# Patient Record
Sex: Male | Born: 1980
Health system: Southern US, Community
[De-identification: ages and names within clinical notes are randomized; demographics above are authoritative.]

## PROBLEM LIST (undated history)

## (undated) ENCOUNTER — Ambulatory Visit (HOSPITAL_COMMUNITY): Discharge status: Absent without leave | Payer: Self-pay

## (undated) DIAGNOSIS — D860 Sarcoidosis of lung: Secondary | ICD-10-CM

## (undated) HISTORY — PX: LUNG BIOPSY: SHX232

---

## 2013-02-27 ENCOUNTER — Emergency Department (HOSPITAL_COMMUNITY): Payer: Self-pay

## 2013-02-27 ENCOUNTER — Encounter (HOSPITAL_COMMUNITY): Payer: Self-pay | Admitting: Family Medicine

## 2013-02-27 ENCOUNTER — Emergency Department (HOSPITAL_COMMUNITY)
Admission: EM | Admit: 2013-02-27 | Discharge: 2013-02-27 | Disposition: A | Discharge status: Home / Self Care | Payer: Self-pay | Attending: Emergency Medicine | Admitting: Emergency Medicine

## 2013-02-27 DIAGNOSIS — Y9372 Activity, wrestling: Secondary | ICD-10-CM | POA: Insufficient documentation

## 2013-02-27 DIAGNOSIS — X500XXA Overexertion from strenuous movement or load, initial encounter: Secondary | ICD-10-CM | POA: Insufficient documentation

## 2013-02-27 DIAGNOSIS — S8990XA Unspecified injury of unspecified lower leg, initial encounter: Secondary | ICD-10-CM | POA: Insufficient documentation

## 2013-02-27 DIAGNOSIS — F17200 Nicotine dependence, unspecified, uncomplicated: Secondary | ICD-10-CM | POA: Insufficient documentation

## 2013-02-27 DIAGNOSIS — Y9229 Other specified public building as the place of occurrence of the external cause: Secondary | ICD-10-CM | POA: Insufficient documentation

## 2013-02-27 DIAGNOSIS — S8991XA Unspecified injury of right lower leg, initial encounter: Secondary | ICD-10-CM

## 2013-02-27 MED ORDER — CYCLOBENZAPRINE HCL 10 MG PO TABS: 10.0000 mg | ORAL_TABLET | Freq: Two times a day (BID) | ORAL | Status: DC | PRN

## 2013-02-27 MED ORDER — HYDROCODONE-ACETAMINOPHEN 5-325 MG PO TABS: 1.0000 | ORAL_TABLET | ORAL | Status: DC | PRN

## 2013-02-27 MED ORDER — OXYCODONE-ACETAMINOPHEN 5-325 MG PO TABS: 1.0000 | ORAL_TABLET | Freq: Once | ORAL | Status: DC

## 2013-02-27 NOTE — ED Notes (Signed)
Per pt sts 2 days ago was wrestling and felt right knee pop. sts can put some weight on it. sts tried brace but not working

## 2013-02-27 NOTE — ED Provider Notes (Signed)
Medical screening examination/treatment/procedure(s) were performed by non-physician practitioner and as supervising physician I was immediately available for consultation/collaboration.   Gwyneth Sprout, MD 02/27/13 2356

## 2013-02-27 NOTE — ED Provider Notes (Signed)
History    This chart was scribed for non-physician practitioner working with Gwyneth Sprout, MD by Frederik Pear, ED Scribe. This patient was seen in room TR04C/TR04C and the patient's care was started at 2047.   CSN: [id number]  Arrival date & time 02/27/13  1756   First MD Initiated Contact with Patient 02/27/13 2047      Chief Complaint  Patient presents with  . Knee Pain    (Consider location/radiation/quality/duration/timing/severity/associated sxs/prior treatment) The history is provided by the patient. No language interpreter was used.    Michael Duarte is a 32 y.o. male who presents to the Emergency Department complaining of sudden onset, constant, non-radiating right knee pain that began 2 days ago when he was wrestling with some friends and heard a pop in his knee. He denies any pain to his ankles or hips. He denies any h/o of previous knee injuries or surgeries. He reports that the treated the pain at home with ibuprofen and applying a knee brace, which provided no relief.   History reviewed. No pertinent past medical history.  History reviewed. No pertinent past surgical history.  History reviewed. No pertinent family history.  History  Substance Use Topics  . Smoking status: Current Every Day Smoker  . Smokeless tobacco: Not on file  . Alcohol Use: Yes      Review of Systems  Constitutional: Negative for fever and chills.  Musculoskeletal: Positive for arthralgias.  All other systems reviewed and are negative.    Allergies  Review of patient's allergies indicates no known allergies.  Home Medications   Current Outpatient Rx  Name  Route  Sig  Dispense  Refill  . ibuprofen (ADVIL,MOTRIN) 200 MG tablet   Oral   Take 600 mg by mouth 2 (two) times daily as needed for pain.         . naproxen sodium (ANAPROX) 220 MG tablet   Oral   Take 660 mg by mouth 2 (two) times daily as needed (for pain).            BP 136/71  Pulse 78  Temp(Src) 98.9 F  (37.2 C) (Oral)  Resp 18  SpO2 97%  Physical Exam  Nursing note and vitals reviewed. Constitutional: He is oriented to person, place, and time. He appears well-developed and well-nourished. No distress.  HENT:  Head: Normocephalic and atraumatic.  Eyes: EOM are normal.  Neck: Neck supple. No tracheal deviation present.  Cardiovascular: Normal rate.   Pulmonary/Chest: Effort normal. No respiratory distress.  Musculoskeletal: Normal range of motion. He exhibits tenderness.  Pain over medial and lateral aspects of the right knee. Negative anterior and posterior drawer tests. Pain with varus and valgus maneuver. Negative Lachman test. Normal hip flexion and normal ankle ROM.  Neurological: He is alert and oriented to person, place, and time.  Skin: Skin is warm and dry.  Psychiatric: He has a normal mood and affect. His behavior is normal.    ED Course  Procedures (including critical care time)  DIAGNOSTIC STUDIES: Oxygen Saturation is 97% on room air, adequate by my interpretation.    COORDINATION OF CARE:  21:58- Discussed planned course of treatment with the patient, including pain medication and crutches, who is agreeable at this time.  22:00- Medication Orders- oxycodone-acetaminophen (Percocet/Roxicet) 5-325 mg per tablet 1 tablet- once.  10:09 PM Likely internal derangement of R knee.  ddx ACL, PCL, MCL tear, meniscal tear, joint effusion, bursitis.  DOubt infectious etiology.   Labs Reviewed - No data to  display Dg Knee Complete 4 Views Right  02/27/2013  *RADIOLOGY REPORT*  Clinical Data: Injury, pain.  RIGHT KNEE - COMPLETE 4+ VIEW  Comparison: None.  Findings: Imaged bones, joints and soft tissues appear normal.  IMPRESSION: Negative exam.   Original Report Authenticated By: Holley Dexter, M.D.      1. Right knee injury, initial encounter    BP 136/71  Pulse 78  Temp(Src) 98.9 F (37.2 C) (Oral)  Resp 18  SpO2 97%  I have reviewed nursing notes and vital  signs. I personally reviewed the imaging tests through PACS system  I reviewed available ER/hospitalization records thought the EMR     MDM  I personally performed the services described in this documentation, which was scribed in my presence. The recorded information has been reviewed and is accurate.         Fayrene Helper, PA-C 02/27/13 2210

## 2013-02-27 NOTE — ED Notes (Signed)
Patient said he was wrestling and injured his right knee. Patient said he injured that knee in high school after hyper-extending it playing football.

## 2013-03-18 ENCOUNTER — Emergency Department (HOSPITAL_COMMUNITY)
Admission: EM | Admit: 2013-03-18 | Discharge: 2013-03-18 | Disposition: A | Discharge status: Home / Self Care | Payer: No Typology Code available for payment source | Attending: Emergency Medicine | Admitting: Emergency Medicine

## 2013-03-18 ENCOUNTER — Encounter (HOSPITAL_COMMUNITY): Payer: Self-pay | Admitting: Adult Health

## 2013-03-18 ENCOUNTER — Emergency Department (HOSPITAL_COMMUNITY): Payer: No Typology Code available for payment source

## 2013-03-18 DIAGNOSIS — F17200 Nicotine dependence, unspecified, uncomplicated: Secondary | ICD-10-CM | POA: Insufficient documentation

## 2013-03-18 DIAGNOSIS — S8000XA Contusion of unspecified knee, initial encounter: Secondary | ICD-10-CM | POA: Insufficient documentation

## 2013-03-18 DIAGNOSIS — Y9389 Activity, other specified: Secondary | ICD-10-CM | POA: Insufficient documentation

## 2013-03-18 DIAGNOSIS — Y92410 Unspecified street and highway as the place of occurrence of the external cause: Secondary | ICD-10-CM | POA: Insufficient documentation

## 2013-03-18 DIAGNOSIS — S8001XA Contusion of right knee, initial encounter: Secondary | ICD-10-CM

## 2013-03-18 MED ORDER — HYDROCODONE-ACETAMINOPHEN 5-325 MG PO TABS: 1.0000 | ORAL_TABLET | ORAL | Status: DC | PRN
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
## 2013-03-18 NOTE — ED Notes (Signed)
Patient is alert and orientedx4.  Patient was explained discharge instructions and they understood them with no questions.  Patient is taking a cab home. 

## 2013-03-18 NOTE — ED Provider Notes (Signed)
Medical screening examination/treatment/procedure(s) were performed by non-physician practitioner and as supervising physician I was immediately available for consultation/collaboration.  Teasia Zapf K Ansh Fauble, MD 03/18/13 0804 

## 2013-08-24 ENCOUNTER — Other Ambulatory Visit (HOSPITAL_COMMUNITY)
Admission: RE | Admit: 2013-08-24 | Discharge: 2013-08-24 | Disposition: A | Discharge status: Home / Self Care | Payer: PRIVATE HEALTH INSURANCE | Source: Ambulatory Visit | Attending: Family Medicine | Admitting: Family Medicine

## 2013-08-24 ENCOUNTER — Emergency Department (INDEPENDENT_AMBULATORY_CARE_PROVIDER_SITE_OTHER)
Admission: EM | Admit: 2013-08-24 | Discharge: 2013-08-24 | Disposition: A | Discharge status: Home / Self Care | Payer: PRIVATE HEALTH INSURANCE | Source: Home / Self Care | Attending: Family Medicine | Admitting: Family Medicine

## 2013-08-24 ENCOUNTER — Encounter (HOSPITAL_COMMUNITY): Payer: Self-pay | Admitting: Emergency Medicine

## 2013-08-24 DIAGNOSIS — Z113 Encounter for screening for infections with a predominantly sexual mode of transmission: Secondary | ICD-10-CM | POA: Insufficient documentation

## 2013-08-24 DIAGNOSIS — N342 Other urethritis: Secondary | ICD-10-CM

## 2013-08-24 LAB — RPR: RPR Ser Ql: NONREACTIVE

## 2013-08-24 LAB — POCT URINALYSIS DIP (DEVICE)
Bilirubin Urine: NEGATIVE
Glucose, UA: NEGATIVE mg/dL
Leukocytes, UA: NEGATIVE
Nitrite: NEGATIVE

## 2013-08-24 MED ORDER — AZITHROMYCIN 250 MG PO TABS
ORAL_TABLET | ORAL | Status: AC
  Filled 2013-08-24: qty 4

## 2013-08-24 MED ORDER — AZITHROMYCIN 250 MG PO TABS
1000.0000 mg | ORAL_TABLET | Freq: Once | ORAL | Status: AC
  Administered 2013-08-24: 1000 mg via ORAL

## 2013-08-24 MED ORDER — LIDOCAINE HCL (PF) 1 % IJ SOLN
INTRAMUSCULAR | Status: AC
  Filled 2013-08-24: qty 5

## 2013-08-24 MED ORDER — CEFTRIAXONE SODIUM 250 MG IJ SOLR
250.0000 mg | Freq: Once | INTRAMUSCULAR | Status: AC
  Administered 2013-08-24: 250 mg via INTRAMUSCULAR

## 2013-08-24 MED ORDER — CEFTRIAXONE SODIUM 250 MG IJ SOLR
INTRAMUSCULAR | Status: AC
  Filled 2013-08-24: qty 250

## 2013-08-24 NOTE — ED Notes (Signed)
Pt up for D/C but awaiting post rocephin inj and lab draw.

## 2013-08-24 NOTE — ED Provider Notes (Signed)
CSN: [id number]     Arrival date & time 08/24/13  4098 History   First MD Initiated Contact with Patient 08/24/13 1046     Chief Complaint  Patient presents with  . SEXUALLY TRANSMITTED DISEASE   (Consider location/radiation/quality/duration/timing/severity/associated sxs/prior Treatment) HPI Comments: 32 year old smoker male with no significant past medical history here complaining of burning on urination since last night. Denies discharge from penis. He is concerned about a sexually transmitted disease has symptoms feel similar to when he had Chlamydia in the past. Denies abdominal pain, fever or chills. No scrotal, inguinal or testicular pain, swelling or redness.   History reviewed. No pertinent past medical history. History reviewed. No pertinent past surgical history. History reviewed. No pertinent family history. History  Substance Use Topics  . Smoking status: Current Every Day Smoker  . Smokeless tobacco: Not on file  . Alcohol Use: Yes    Review of Systems  Constitutional: Negative for fever and chills.  HENT: Negative for sore throat.   Gastrointestinal: Negative for abdominal pain.  Genitourinary: Positive for dysuria. Negative for hematuria, flank pain, scrotal swelling, genital sores and testicular pain.  Skin: Negative for rash.  All other systems reviewed and are negative.    Allergies  Review of patient's allergies indicates no known allergies.  Home Medications   Current Outpatient Rx  Name  Route  Sig  Dispense  Refill  . HYDROcodone-acetaminophen (NORCO/VICODIN) 5-325 MG per tablet   Oral   Take 1 tablet by mouth every 4 (four) hours as needed for pain.   12 tablet   0    There were no vitals taken for this visit. Physical Exam  Nursing note and vitals reviewed. Constitutional: He is oriented to person, place, and time. He appears well-developed and well-nourished. No distress.  HENT:  Head: Normocephalic and atraumatic.  Mouth/Throat: Oropharynx  is clear and moist. No oropharyngeal exudate.  Eyes: No scleral icterus.  Cardiovascular: Normal heart sounds.   Abdominal: Soft. There is no tenderness.  Genitourinary: Testes normal and penis normal. Cremasteric reflex is present. Circumcised. No penile erythema. No discharge found.  Lymphadenopathy:    He has no cervical adenopathy.       Right: No inguinal adenopathy present.       Left: No inguinal adenopathy present.  Neurological: He is alert and oriented to person, place, and time.  Skin: No rash noted. He is not diaphoretic.    ED Course  Procedures (including critical care time) Labs Review Labs Reviewed  POCT URINALYSIS DIP (DEVICE)  CERVICOVAGINAL ANCILLARY ONLY   Imaging Review No results found.  MDM   1. Urethritis    Treated empirically with Rocephin and 250 mg IM x1 and a azithromycin 1 g oral x1. GC Chlamydia and Trichomonas test pending at the time of discharge. Supportive care and red flags that should stricture to medical attention discussed with patient and provided in writing.  Sharin Grave, MD 08/25/13 [phone]

## 2013-08-24 NOTE — ED Notes (Signed)
C/o std.  Patient states partner says she has BV.  Patient states that when he urinates it feels funny.  Patient is unable to describe the feeling.   Denies discharge.

## 2017-06-19 ENCOUNTER — Ambulatory Visit (HOSPITAL_COMMUNITY)
Admission: EM | Admit: 2017-06-19 | Discharge: 2017-06-19 | Disposition: A | Discharge status: Home / Self Care | Payer: Medicaid Other | Attending: Internal Medicine | Admitting: Internal Medicine

## 2017-06-19 ENCOUNTER — Encounter (HOSPITAL_COMMUNITY): Payer: Self-pay | Admitting: Emergency Medicine

## 2017-06-19 DIAGNOSIS — Z202 Contact with and (suspected) exposure to infections with a predominantly sexual mode of transmission: Secondary | ICD-10-CM

## 2017-06-19 DIAGNOSIS — Z113 Encounter for screening for infections with a predominantly sexual mode of transmission: Secondary | ICD-10-CM

## 2017-06-19 HISTORY — DX: Sarcoidosis of lung: D86.0

## 2017-06-19 MED ORDER — AZITHROMYCIN 250 MG PO TABS
ORAL_TABLET | ORAL | Status: AC
  Filled 2017-06-19: qty 4

## 2017-06-19 MED ORDER — CEFTRIAXONE SODIUM 250 MG IJ SOLR
250.0000 mg | Freq: Once | INTRAMUSCULAR | Status: AC
  Administered 2017-06-19: 250 mg via INTRAMUSCULAR

## 2017-06-19 MED ORDER — AZITHROMYCIN 250 MG PO TABS
1000.0000 mg | ORAL_TABLET | Freq: Once | ORAL | Status: AC
  Administered 2017-06-19: 1000 mg via ORAL

## 2017-06-19 MED ORDER — CEFTRIAXONE SODIUM 250 MG IJ SOLR
INTRAMUSCULAR | Status: AC
  Filled 2017-06-19: qty 250

## 2017-06-19 MED ORDER — STERILE WATER FOR INJECTION IJ SOLN
INTRAMUSCULAR | Status: AC
  Filled 2017-06-19: qty 10
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
## 2017-06-19 NOTE — ED Notes (Signed)
Dirty urine collected. 

## 2017-09-01 ENCOUNTER — Emergency Department (HOSPITAL_COMMUNITY)
Admission: EM | Admit: 2017-09-01 | Discharge: 2017-09-01 | Disposition: A | Discharge status: Home / Self Care | Payer: Medicaid Other | Attending: Emergency Medicine | Admitting: Emergency Medicine

## 2017-09-01 ENCOUNTER — Encounter (HOSPITAL_COMMUNITY): Payer: Self-pay | Admitting: *Deleted

## 2017-09-01 ENCOUNTER — Emergency Department (HOSPITAL_COMMUNITY): Payer: Medicaid Other

## 2017-09-01 DIAGNOSIS — Y990 Civilian activity done for income or pay: Secondary | ICD-10-CM | POA: Insufficient documentation

## 2017-09-01 DIAGNOSIS — Y9289 Other specified places as the place of occurrence of the external cause: Secondary | ICD-10-CM | POA: Insufficient documentation

## 2017-09-01 DIAGNOSIS — W19XXXA Unspecified fall, initial encounter: Secondary | ICD-10-CM

## 2017-09-01 DIAGNOSIS — W11XXXA Fall on and from ladder, initial encounter: Secondary | ICD-10-CM | POA: Insufficient documentation

## 2017-09-01 DIAGNOSIS — D860 Sarcoidosis of lung: Secondary | ICD-10-CM | POA: Insufficient documentation

## 2017-09-01 DIAGNOSIS — S199XXA Unspecified injury of neck, initial encounter: Secondary | ICD-10-CM | POA: Insufficient documentation

## 2017-09-01 DIAGNOSIS — S0990XA Unspecified injury of head, initial encounter: Secondary | ICD-10-CM

## 2017-09-01 DIAGNOSIS — F17200 Nicotine dependence, unspecified, uncomplicated: Secondary | ICD-10-CM | POA: Diagnosis not present

## 2017-09-01 DIAGNOSIS — Y9389 Activity, other specified: Secondary | ICD-10-CM | POA: Insufficient documentation

## 2017-09-01 DIAGNOSIS — S3992XA Unspecified injury of lower back, initial encounter: Secondary | ICD-10-CM | POA: Diagnosis not present

## 2017-09-01 MED ORDER — LIDOCAINE 5 % EX PTCH: 1.0000 | MEDICATED_PATCH | CUTANEOUS | 0 refills | Status: DC

## 2017-09-01 MED ORDER — IBUPROFEN 400 MG PO TABS
600.0000 mg | ORAL_TABLET | Freq: Once | ORAL | Status: AC
  Administered 2017-09-01: 600 mg via ORAL
  Filled 2017-09-01: qty 1

## 2017-09-01 MED ORDER — METHOCARBAMOL 500 MG PO TABS: 500.0000 mg | ORAL_TABLET | Freq: Two times a day (BID) | ORAL | 0 refills | Status: DC

## 2017-09-01 MED ORDER — IBUPROFEN 600 MG PO TABS: 600.0000 mg | ORAL_TABLET | Freq: Four times a day (QID) | ORAL | 0 refills | Status: DC | PRN

## 2017-09-01 MED ORDER — HYDROCODONE-ACETAMINOPHEN 5-325 MG PO TABS: 1.0000 | ORAL_TABLET | Freq: Four times a day (QID) | ORAL | 0 refills | Status: DC | PRN

## 2017-09-01 MED ORDER — METHOCARBAMOL 500 MG PO TABS
1000.0000 mg | ORAL_TABLET | Freq: Once | ORAL | Status: AC
  Administered 2017-09-01: 1000 mg via ORAL
  Filled 2017-09-01: qty 2

## 2017-09-01 MED ORDER — OXYCODONE-ACETAMINOPHEN 5-325 MG PO TABS
2.0000 | ORAL_TABLET | Freq: Once | ORAL | Status: AC
  Administered 2017-09-01: 2 via ORAL
  Filled 2017-09-01: qty 2

## 2017-09-01 NOTE — ED Provider Notes (Signed)
[facility]-EMERGENCY DEPT Provider Note   CSN: [id number] Arrival date & time: 09/01/17  1011     History   Chief Complaint Chief Complaint  Patient presents with  . Fall    HPI Michael Duarte is a 36 y.o. male.  HPI  Michael Duarte is a 36 y.o. male, presenting to the ED For evaluation following a fall that occurred yesterday around noon. 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 He landed flat on his back and endorses hitting the back of his head. Patient currently complains of right occipital head pain, described as a soreness, 7/10, nonradiating. Midline posterior neck pain is, 7/10, described as a tightness, nonradiating. Midline lumbar back pain, 10/10, nonradiating.   Denies LOC, nausea/vomiting, changes in bowel or bladder function, hematuria, neuro deficits, vision changes, chest pain, shortness of breath, abdominal pain, or any other complaints.   Past Medical History:  Diagnosis Date  . Sarcoidosis of lung (HCC)     There are no active problems to display for this patient.   History reviewed. No pertinent surgical history.     Home Medications    Prior to Admission medications   Medication Sig Start Date End Date Taking? Authorizing Provider  HYDROcodone-acetaminophen (NORCO/VICODIN) 5-325 MG tablet Take 1-2 tablets by mouth every 6 (six) hours as needed for severe pain. 09/01/17   Kristie Bracewell C, PA-C  ibuprofen (ADVIL,MOTRIN) 600 MG tablet Take 1 tablet (600 mg total) by mouth every 6 (six) hours as needed. 09/01/17   Spiro Ausborn C, PA-C  lidocaine (LIDODERM) 5 % Place 1 patch onto the skin daily. Remove & Discard patch within 12 hours or as directed by MD 09/01/17   Laurynn Mccorvey C, PA-C  methocarbamol (ROBAXIN) 500 MG tablet Take 1 tablet (500 mg total) by mouth 2 (two) times daily. 09/01/17   Kallin Henk, Hillard DankerShawn C, PA-C    Family History No family history on file.  Social History Social History  Substance Use Topics  . Smoking status: Current  Every Day Smoker  . Smokeless tobacco: Never Used  . Alcohol use Yes     Allergies   Patient has no known allergies.   Review of Systems Review of Systems  Respiratory: Negative for shortness of breath.   Cardiovascular: Negative for chest pain.  Gastrointestinal: Negative for abdominal pain, nausea and vomiting.  Genitourinary: Negative for difficulty urinating and hematuria.  Musculoskeletal: Positive for back pain and neck pain.  Neurological: Positive for headaches. Negative for dizziness, syncope, weakness, light-headedness and numbness.  All other systems reviewed and are negative.    Physical Exam Updated Vital Signs BP (!) 145/98 (BP Location: Right Arm)   Pulse 73   Temp 98.3 F (36.8 C) (Oral)   Resp 20   SpO2 98%   Physical Exam  Constitutional: He appears well-developed and well-nourished. No distress.  HENT:  Head: Normocephalic and atraumatic. Head is without raccoon's eyes and without Battle's sign.  Right Ear: Tympanic membrane, external ear and ear canal normal. No hemotympanum.  Left Ear: Tympanic membrane, external ear and ear canal normal. No hemotympanum.  Mouth/Throat: Oropharynx is clear and moist.  Eyes: Pupils are equal, round, and reactive to light. Conjunctivae and EOM are normal.  Neck: Neck supple.  Cardiovascular: Normal rate, regular rhythm, normal heart sounds and intact distal pulses.   Pulmonary/Chest: Effort normal and breath sounds normal. No respiratory distress.  Abdominal: Soft. There is no tenderness. There is no guarding.  Musculoskeletal: He  exhibits tenderness. He exhibits no edema.  Tenderness to the midline cervical spine and flanking musculature. Tenderness to the midline lumbar musculature. No noted step-off, deformity, crepitus, or swelling.  Full range of motion in each of the cardinal directions of the joints of the upper extremities. Full flexion and extension intact in the hips, knees, and ankles bilaterally.  No  CVA tenderness or ecchymosis. Overall trauma exam performed with no abnormalities noted other than those indicated.  Neurological: He is alert.  No sensory deficits.  No noted speech deficits. No aphasia. Patient handles oral secretions without difficulty. No noted swallowing defects.  Equal grip strength bilaterally. Strength 5/5 in the upper extremities. Strength 5/5 with flexion and extension of the hips, knees, and ankles bilaterally.  Patellar DTRs 2+ bilaterally. Negative Romberg. No gait disturbance.  Coordination intact including heel to shin and finger to nose.  Cranial nerves III-XII grossly intact.  No facial droop.   Skin: Skin is warm and dry. Capillary refill takes less than 2 seconds. He is not diaphoretic.  Psychiatric: He has a normal mood and affect. His behavior is normal.  Nursing note and vitals reviewed.    ED Treatments / Results  Labs (all labs ordered are listed, but only abnormal results are displayed) Labs Reviewed - No data to display  EKG  EKG Interpretation None       Radiology Dg Thoracic Spine 2 View  Result Date: 09/01/2017 CLINICAL DATA:  Larey Seat off of a ladder yesterday and injured back. EXAM: THORACIC SPINE 2 VIEWS COMPARISON:  None. FINDINGS: Normal alignment of the thoracic vertebral bodies. No acute fracture is identified. No abnormal paraspinal soft tissue swelling. The visualized posterior ribs appear intact. The lungs are grossly clear. IMPRESSION: Normal alignment and no acute bony findings. Electronically Signed   By: Rudie Meyer M.D.   On: 09/01/2017 13:05   Dg Lumbar Spine Complete  Result Date: 09/01/2017 CLINICAL DATA:  Midline back tenderness after fall. EXAM: LUMBAR SPINE - COMPLETE 4+ VIEW COMPARISON:  None. FINDINGS: There are 5 lumbar type vertebral bodies. There is no evidence of lumbar spine fracture. Vertebral body heights are preserved. Straightening of the normal lumbar lordosis. Alignment is otherwise normal.  Intervertebral disc spaces are maintained. No pars defects. Bone mineralization is normal. IMPRESSION: Negative. Electronically Signed   By: Obie Dredge M.D.   On: 09/01/2017 13:01   Ct Head Wo Contrast  Result Date: 09/01/2017 CLINICAL DATA:  Pt fell off a ladder yesterday and fell back onto his back flat on a wooden porch. Pt states he was up 6-8 feet. Pt denies LOC, but reports hit head. Complaining of lower back pain, neck pain, and headache. EXAM: CT HEAD WITHOUT CONTRAST CT CERVICAL SPINE WITHOUT CONTRAST TECHNIQUE: Multidetector CT imaging of the head and cervical spine was performed following the standard protocol without intravenous contrast. Multiplanar CT image reconstructions of the cervical spine were also generated. COMPARISON:  None. FINDINGS: CT HEAD FINDINGS Brain: No evidence of acute infarction, hemorrhage, hydrocephalus, extra-axial collection or mass lesion/mass effect. Vascular: No hyperdense vessel or unexpected calcification. Skull: No osseous abnormality. Sinuses/Orbits: Visualized paranasal sinuses are clear. Visualized mastoid sinuses are clear. Visualized orbits demonstrate no focal abnormality. Other: None CT CERVICAL SPINE FINDINGS Alignment: Normal. Skull base and vertebrae: No acute fracture. No primary bone lesion or focal pathologic process. Soft tissues and spinal canal: No prevertebral fluid or swelling. No visible canal hematoma. Disc levels: Disc spaces are maintained. Left paracentral disc protrusion at C4-5. Bilateral neural foramina  are patent. Upper chest: Mild biapical scarring Other: No fluid collection or hematoma. IMPRESSION: 1. No acute intracranial pathology. 2. No acute osseous injury the cervical spine. Electronically Signed   By: Elige Ko   On: 09/01/2017 12:49   Ct Cervical Spine Wo Contrast  Result Date: 09/01/2017 CLINICAL DATA:  Pt fell off a ladder yesterday and fell back onto his back flat on a wooden porch. Pt states he was up 6-8 feet. Pt  denies LOC, but reports hit head. Complaining of lower back pain, neck pain, and headache. EXAM: CT HEAD WITHOUT CONTRAST CT CERVICAL SPINE WITHOUT CONTRAST TECHNIQUE: Multidetector CT imaging of the head and cervical spine was performed following the standard protocol without intravenous contrast. Multiplanar CT image reconstructions of the cervical spine were also generated. COMPARISON:  None. FINDINGS: CT HEAD FINDINGS Brain: No evidence of acute infarction, hemorrhage, hydrocephalus, extra-axial collection or mass lesion/mass effect. Vascular: No hyperdense vessel or unexpected calcification. Skull: No osseous abnormality. Sinuses/Orbits: Visualized paranasal sinuses are clear. Visualized mastoid sinuses are clear. Visualized orbits demonstrate no focal abnormality. Other: None CT CERVICAL SPINE FINDINGS Alignment: Normal. Skull base and vertebrae: No acute fracture. No primary bone lesion or focal pathologic process. Soft tissues and spinal canal: No prevertebral fluid or swelling. No visible canal hematoma. Disc levels: Disc spaces are maintained. Left paracentral disc protrusion at C4-5. Bilateral neural foramina are patent. Upper chest: Mild biapical scarring Other: No fluid collection or hematoma. IMPRESSION: 1. No acute intracranial pathology. 2. No acute osseous injury the cervical spine. Electronically Signed   By: Elige Ko   On: 09/01/2017 12:49    Procedures Procedures (including critical care time)  Medications Ordered in ED Medications  oxyCODONE-acetaminophen (PERCOCET/ROXICET) 5-325 MG per tablet 2 tablet (2 tablets Oral Given 09/01/17 1153)  ibuprofen (ADVIL,MOTRIN) tablet 600 mg (600 mg Oral Given 09/01/17 1153)  methocarbamol (ROBAXIN) tablet 1,000 mg (1,000 mg Oral Given 09/01/17 1152)     Initial Impression / Assessment and Plan / ED Course  I have reviewed the triage vital signs and the nursing notes.  Pertinent labs & imaging results that were available during my care of  the patient were reviewed by me and considered in my medical decision making (see chart for details).  Clinical Course as of Sep 02 1455  Tue Sep 01, 2017  1300 Patient states his pain is improved to 4/10 all around. Discussed negative head and cervical spine CTs. Removed patient's cervical collar.  [SJ]    Clinical Course User Index [SJ] Demaryius Imran C, PA-C    Patient presents for evaluation following a fall yesterday. Distance of the fall was significant enough to warrant CT of the head and cervical spine. No acute abnormalities on imaging. No neuro or functional deficits. No signs of serious head injury. Patient to follow-up with PCP, orthopedics, and/or concussion clinic, if needed. The patient was given instructions for home care as well as return precautions. Patient voices understanding of these instructions, accepts the plan, and is comfortable with discharge.    Vitals:   09/01/17 1028 09/01/17 1138 09/01/17 1145 09/01/17 1447  BP: (!) 145/98 131/75 128/80 140/86  Pulse: 73 69 64 61  Resp: Temp: 98.3 F (36.8 C)     TempSrc: Oral     SpO2: 98% 99% 98% 100%     Final Clinical Impressions(s) / ED Diagnoses   Final diagnoses:  Fall, initial encounter  Injury of head, initial encounter  Injury of neck, initial  encounter  Injury of back, initial encounter    New Prescriptions Discharge Medication List as of 09/01/2017  2:21 PM    START taking these medications   Details  ibuprofen (ADVIL,MOTRIN) 600 MG tablet Take 1 tablet (600 mg total) by mouth every 6 (six) hours as needed., Starting Tue 09/01/2017, Print    lidocaine (LIDODERM) 5 % Place 1 patch onto the skin daily. Remove & Discard patch within 12 hours or as directed by MD, Starting Tue 09/01/2017, Print    methocarbamol (ROBAXIN) 500 MG tablet Take 1 tablet (500 mg total) by mouth 2 (two) times daily., Starting Tue 09/01/2017, Print         Izayah Miner C, PA-C 09/01/17 1458    Pricilla Loveless,  MD 09/04/17 [phone]

## 2017-09-01 NOTE — Discharge Instructions (Signed)
Expect your soreness to increase over the next 2-3 days. Take it easy, but do not lay around too much as this may make any stiffness worse.  Antiinflammatory medications: Take 600 mg of ibuprofen every 6 hours or 440 mg (over the counter dose) to 500 mg (prescription dose) of naproxen every 12 hours for the next 3 days. After this time, these medications may be used as needed for pain. Take these medications with food to avoid upset stomach. Choos dditional pain while taking the ibuprofen or naproxen, you may add in tylenol as needed. Your daily total maximum amount of tylenol from all sources should be limited to 4000mg /day for persons without liver problems, or /day for those with liver problems. Muscle relaxer: Robaxin is a muscle relaxer and may help loosen stiff muscles. Do not take the Robaxin while driving or performing other dangerous activities.  Lidocaine patches: These are available via either prescription or over-the-counter. The over-the-counter option may be more economical one and are likely just as effective. There are multiple over-the-counter brands, such as Salonpas. Exercises: Be sure to perform the attached exercises starting with three times a week and working up to performing them daily. This is an essential part of preventing long term problems.   Follow up with a primary care provider for any future management of these complaints. May also follow-up with the orthopedic specialist on this matter.   Head Injury You have been seen today for a head injury. It does not appear to be serious at this time.  Close observation: The close observation period is usually 6 hours from the injury. This includes staying awake and having a trustworthy adult monitor you to assure your condition does not worsen. You should be in regular contact with this person and ideally, they should be able to  monitor you in person.  Secondary observation: The secondary observation period is usually 24 hours from the injury. You are allowed to sleep during this time. A trustworthy adult should intermittently monitor you to assure your condition does not worsen.   Overall head injury/concussion care: Rest: Be sure to get plenty of rest. You will need more rest and sleep while you recover. Hydration: Be sure to stay well hydrated by having a goal of drinking about 0.5 liters of water an hour. Pain:  Antiinflammatory medications: Take 600 mg of ibuprofen every 6 hours or 440 mg (over the counter dose) to 500 mg (prescription dose) of naproxen every 12 hours or for the next 3 days. After this time, these medications may be used as needed for pain. Take these medications with food to avoid upset stomach. Choose only one of these medications, do not take them together. Tylenol: Should you continue to have additional pain while taking the ibuprofen or naproxen, you may add in tylenol as needed. Your daily total maximum amount of tylenol from all sources should be limited to /day for persons without liver problems, or /day for those with liver problems. Return to sports and activities: In general, you may return to normal activities once symptoms have subsided, however, you would ideally be cleared by a primary care provider or other qualified medical professional prior to return to these activities.  Follow up: Follow up with the concussion clinic or your primary care provider for further management of this issue. Return: Return to the ED should any symptoms worsen.

## 2017-09-01 NOTE — ED Triage Notes (Signed)
Pt fell off a ladder yesterday and fell back onto his back flat on a wooden porch.  Pt states he was up 6-8 feet.  Pt denies LOC, but reports hit head.  Complaining of lower back pain, neck pain, and headache.  Ambulatory.  Collared at triage

## 2017-12-23 ENCOUNTER — Emergency Department (HOSPITAL_COMMUNITY): Payer: Medicaid Other

## 2017-12-23 ENCOUNTER — Encounter (HOSPITAL_COMMUNITY): Payer: Self-pay | Admitting: Emergency Medicine

## 2017-12-23 ENCOUNTER — Emergency Department (HOSPITAL_COMMUNITY)
Admission: EM | Admit: 2017-12-23 | Discharge: 2017-12-23 | Disposition: A | Discharge status: Home / Self Care | Payer: Medicaid Other | Attending: Emergency Medicine | Admitting: Emergency Medicine

## 2017-12-23 DIAGNOSIS — D8689 Sarcoidosis of other sites: Secondary | ICD-10-CM | POA: Insufficient documentation

## 2017-12-23 DIAGNOSIS — Z87891 Personal history of nicotine dependence: Secondary | ICD-10-CM | POA: Insufficient documentation

## 2017-12-23 DIAGNOSIS — J029 Acute pharyngitis, unspecified: Secondary | ICD-10-CM | POA: Insufficient documentation

## 2017-12-23 DIAGNOSIS — R05 Cough: Secondary | ICD-10-CM | POA: Insufficient documentation

## 2017-12-23 DIAGNOSIS — Z79899 Other long term (current) drug therapy: Secondary | ICD-10-CM | POA: Insufficient documentation

## 2017-12-23 DIAGNOSIS — R509 Fever, unspecified: Secondary | ICD-10-CM | POA: Insufficient documentation

## 2017-12-23 DIAGNOSIS — R69 Illness, unspecified: Secondary | ICD-10-CM

## 2017-12-23 DIAGNOSIS — J111 Influenza due to unidentified influenza virus with other respiratory manifestations: Secondary | ICD-10-CM

## 2017-12-23 DIAGNOSIS — R6889 Other general symptoms and signs: Secondary | ICD-10-CM | POA: Insufficient documentation

## 2017-12-23 DIAGNOSIS — D869 Sarcoidosis, unspecified: Secondary | ICD-10-CM

## 2017-12-23 DIAGNOSIS — R0981 Nasal congestion: Secondary | ICD-10-CM | POA: Diagnosis present

## 2017-12-23 LAB — CBC WITH DIFFERENTIAL/PLATELET
BASOS ABS: 0 10*3/uL (ref 0.0–0.1)
Basophils Relative: 0 %
EOS PCT: 2 %
Eosinophils Absolute: 0.1 10*3/uL (ref 0.0–0.7)
HCT: 42.2 % (ref 39.0–52.0)
Hemoglobin: 14.4 g/dL (ref 13.0–17.0)
Lymphocytes Relative: 26 %
Lymphs Abs: 1.5 10*3/uL (ref 0.7–4.0)
MCH: 30.8 pg (ref 26.0–34.0)
MCHC: 34.1 g/dL (ref 30.0–36.0)
MCV: 90.2 fL (ref 78.0–100.0)
MONO ABS: 0.7 10*3/uL (ref 0.1–1.0)
Monocytes Relative: 12 %
Neutro Abs: 3.5 10*3/uL (ref 1.7–7.7)
Neutrophils Relative %: 60 %
PLATELETS: 258 10*3/uL (ref 150–400)
RBC: 4.68 MIL/uL (ref 4.22–5.81)
RDW: 13.5 % (ref 11.5–15.5)
WBC: 5.8 10*3/uL (ref 4.0–10.5)

## 2017-12-23 LAB — COMPREHENSIVE METABOLIC PANEL
ALBUMIN: 3.8 g/dL (ref 3.5–5.0)
ALK PHOS: 118 U/L (ref 38–126)
ALT: 23 U/L (ref 17–63)
AST: 26 U/L (ref 15–41)
Anion gap: 7 (ref 5–15)
BILIRUBIN TOTAL: 0.7 mg/dL (ref 0.3–1.2)
BUN: 12 mg/dL (ref 6–20)
CALCIUM: 8.7 mg/dL — AB (ref 8.9–10.3)
CO2: 23 mmol/L (ref 22–32)
Chloride: 106 mmol/L (ref 101–111)
Creatinine, Ser: 1 mg/dL (ref 0.61–1.24)
GFR calc Af Amer: >60 mL/min (ref >60)
GFR calc non Af Amer: >60 mL/min (ref >60)
GLUCOSE: 89 mg/dL (ref 65–99)
Potassium: 3.8 mmol/L (ref 3.5–5.1)
Sodium: 136 mmol/L (ref 135–145)
TOTAL PROTEIN: 7.1 g/dL (ref 6.5–8.1)

## 2017-12-23 LAB — I-STAT CG4 LACTIC ACID, ED: Lactic Acid, Venous: 1.27 mmol/L (ref 0.5–1.9)

## 2017-12-23 MED ORDER — OSELTAMIVIR PHOSPHATE 75 MG PO CAPS: 75.0000 mg | ORAL_CAPSULE | Freq: Two times a day (BID) | ORAL | 0 refills | Status: DC

## 2017-12-23 MED ORDER — ACETAMINOPHEN 325 MG PO TABS
650.0000 mg | ORAL_TABLET | Freq: Once | ORAL | Status: AC | PRN
  Administered 2017-12-23: 650 mg via ORAL
  Filled 2017-12-23: qty 2

## 2017-12-23 NOTE — ED Provider Notes (Signed)
[facility] Provider [facility]   CSN: [id number] Arrival date & time: 12/23/17  [id number]     History   Chief Complaint Chief Complaint  Patient presents with  . Nasal Congestion  . Cough  . Fatigue    HPI Michael Duarte is a 37 y.o. male.  HPI   [age] Old male history of sarcoidosis, on methotrexate, and prednisone presents today with nasal congestion for 1 week, sore throat, and cough that began yesterday with fever to 101.5 today.  He denies any productive cough or dyspnea.  He has been taking fluids without difficulty.  He has some nausea that started in the past 24 hours.  He has not noted any diarrhea.  He has been taking fluids without difficulty.  He has a 35-year-old son with similar symptoms and is also being seen in the emergency department.  Past Medical History:  Diagnosis Date  . Sarcoidosis of lung (HCC)     There are no active problems to display for this patient.   History reviewed. No pertinent surgical history.     Home Medications    Prior to Admission medications   Medication Sig Start Date End Date Taking? Authorizing Provider  HYDROcodone-acetaminophen (NORCO/VICODIN) 5-325 MG tablet Take 1-2 tablets by mouth every 6 (six) hours as needed for severe pain. 09/01/17   Joy, Shawn C, PA-C  ibuprofen (ADVIL,MOTRIN) 600 MG tablet Take 1 tablet (600 mg total) by mouth every 6 (six) hours as needed. 09/01/17   Joy, Shawn C, PA-C  lidocaine (LIDODERM) 5 % Place 1 patch onto the skin daily. Remove & Discard patch within 12 hours or as directed by MD 09/01/17   Joy, Shawn C, PA-C  methocarbamol (ROBAXIN) 500 MG tablet Take 1 tablet (500 mg total) by mouth 2 (two) times daily. 09/01/17   Joy, Shawn C, PA-C  oseltamivir (TAMIFLU) 75 MG capsule Take 1 capsule (75 mg total) by mouth every 12 (twelve) hours. 12/23/17   Margarita Grizzleay, Ivanka Kirshner, MD    Family History History reviewed. No pertinent family history.  Social History Social History    Tobacco Use  . Smoking status: Former Smoker    Last attempt to quit: 05/23/2016    Years since quitting: 1.5  . Smokeless tobacco: Never Used  Substance Use Topics  . Alcohol use: Yes  . Drug use: No     Allergies   Patient has no known allergies.   Review of Systems Review of Systems  All other systems reviewed and are negative.    Physical Exam Updated Vital Signs BP 124/77 (BP Location: Right Arm)   Pulse 89   Temp (!) 101.5 F (38.6 C) (Oral)   Resp 18   Ht 1.803 m (5\' 11" )   Wt 113.4 kg (250 lb)   SpO2 99%   BMI 34.87 kg/m   Physical Exam  Constitutional: He is oriented to person, place, and time. He appears well-developed and well-nourished.  HENT:  Head: Normocephalic and atraumatic.  Right Ear: External ear normal.  Left Ear: External ear normal.  Nose: Nose normal.  TMs normal bilaterally  Eyes: EOM are normal. Pupils are equal, round, and reactive to light.  Neck: Normal range of motion. Neck supple.  Cardiovascular: Normal rate, regular rhythm and normal heart sounds.  Pulmonary/Chest: Effort normal.  Abdominal: Soft.  Musculoskeletal: Normal range of motion.  Neurological: He is alert and oriented to person, place, and time.  Skin: Skin is warm and dry. Capillary refill takes less  than 2 seconds. No rash noted.  Psychiatric: He has a normal mood and affect.  Nursing note and vitals reviewed.    ED Treatments / Results  Labs (all labs ordered are listed, but only abnormal results are displayed) Labs Reviewed  COMPREHENSIVE METABOLIC PANEL - Abnormal; Notable for the following components:      Result Value   Calcium 8.7 (*)    All other components within normal limits  CBC WITH DIFFERENTIAL/PLATELET  I-STAT CG4 LACTIC ACID, ED  I-STAT CG4 LACTIC ACID, ED    EKG  EKG Interpretation None       Radiology Dg Chest 2 View  Result Date: 12/23/2017 CLINICAL DATA:  Initial evaluation for acute fever, cough. History of sarcoidosis.  EXAM: CHEST  2 VIEW COMPARISON:  None available. FINDINGS: Cardiac and mediastinal silhouettes are within normal limits. Suggestion of hilar adenopathy on lateral projection. Lungs normally inflated. Scattered perihilar and left basilar scarring/architectural distortion, consistent with history of sarcoidosis. No focal infiltrates. No pulmonary edema or pleural effusion. No pneumothorax. No acute osseous abnormality. IMPRESSION: 1. Bilateral perihilar and basilar architectural distortion/scarring with probable hilar adenopathy. Findings consistent with history of sarcoidosis. 2. No other active cardiopulmonary disease. Electronically Signed   By: Rise Mu M.D.   On: 12/23/2017 04:22    Procedures Procedures (including critical care time)  Medications Ordered in ED Medications  acetaminophen (TYLENOL) tablet 650 mg (650 mg Oral Given 12/23/17 0354)     Initial Impression / Assessment and Plan / ED Course  I have reviewed the triage vital signs and the nursing notes.  Pertinent labs & imaging results that were available during my care of the patient were reviewed by me and considered in my medical decision making (see chart for details).    Results for orders placed or performed during the hospital encounter of 12/23/17  Comprehensive metabolic panel  Result Value Ref Range   Sodium 136 135 - 145 mmol/L   Potassium 3.8 3.5 - 5.1 mmol/L   Chloride 106 101 - 111 mmol/L   CO2 23 22 - 32 mmol/L   Glucose, Bld 89 65 - 99 mg/dL   BUN 12 6 - 20 mg/dL   Creatinine, Ser 4.09 0.61 - 1.24 mg/dL   Calcium 8.7 (L) 8.9 - 10.3 mg/dL   Total Protein 7.1 6.5 - 8.1 g/dL   Albumin 3.8 3.5 - 5.0 g/dL   AST 26 15 - 41 U/L   ALT 23 17 - 63 U/L   Alkaline Phosphatase 118 38 - 126 U/L   Total Bilirubin 0.7 0.3 - 1.2 mg/dL   GFR calc non Af Amer >60 >60 mL/min   GFR calc Af Amer >60 >60 mL/min   Anion gap 7 5 - 15  CBC with Differential  Result Value Ref Range   WBC 5.8 4.0 - 10.5 K/uL   RBC  4.68 4.22 - 5.81 MIL/uL   Hemoglobin 14.4 13.0 - 17.0 g/dL   HCT 81.1 91.4 - 78.2 %   MCV 90.2 78.0 - 100.0 fL   MCH 30.8 26.0 - 34.0 pg   MCHC 34.1 30.0 - 36.0 g/dL   RDW 95.6 21.3 - 08.6 %   Platelets 258 150 - 400 K/uL   Neutrophils Relative % 60 %   Neutro Abs 3.5 1.7 - 7.7 K/uL   Lymphocytes Relative 26 %   Lymphs Abs 1.5 0.7 - 4.0 K/uL   Monocytes Relative 12 %   Monocytes Absolute 0.7 0.1 - 1.0 K/uL  Eosinophils Relative 2 %   Eosinophils Absolute 0.1 0.0 - 0.7 K/uL   Basophils Relative 0 %   Basophils Absolute 0.0 0.0 - 0.1 K/uL  I-Stat CG4 Lactic Acid, ED  Result Value Ref Range   Lactic Acid, Venous 1.27 0.5 - 1.9 mmol/L    Well-developed well-nourished 37 year old male with a history of sarcoid who presents with URI symptoms and temp to 101.5.  He did not have a flu shot this year.  He is not dyspneic and is not have a productive cough.  Chest x-Shelva Hetzer shows no evidence of acute infiltrate.  Patient with worsening symptoms in the past 24 hours include fever.  Just getting flu test.  Patient does not wish to wait for testing and does request that we start anti-viral's.  Tamiflu is given.  He is given strict return precautions and voices understanding of need for follow-up.  Final Clinical Impressions(s) / ED Diagnoses   Final diagnoses:  Influenza-like illness  Sarcoidosis    ED Discharge Orders        Ordered    oseltamivir (TAMIFLU) 75 MG capsule  Every 12 hours     12/23/17 0730       Margarita Grizzle, MD 12/23/17 [phone]

## 2017-12-23 NOTE — Discharge Instructions (Signed)
Take tylenol as needed for fever. Recheck with your doctor in 1-2 weeks. Return if worse at any time especially worsening fever, shortness of breath, or unable to keep liquids or medicines down.

## 2017-12-23 NOTE — ED Triage Notes (Signed)
Patient arrives with complaint of head congestion, fatigue, and cough for >2 weeks. States the symptoms had been getting better, but have now gotten worse. Fever in triage @ 101.5.

## 2019-02-26 ENCOUNTER — Encounter (HOSPITAL_COMMUNITY): Payer: Self-pay

## 2019-02-26 ENCOUNTER — Emergency Department (HOSPITAL_COMMUNITY)
Admission: EM | Admit: 2019-02-26 | Discharge: 2019-02-26 | Disposition: A | Discharge status: Home / Self Care | Payer: Medicaid Other | Attending: Emergency Medicine | Admitting: Emergency Medicine

## 2019-02-26 DIAGNOSIS — R072 Precordial pain: Secondary | ICD-10-CM | POA: Insufficient documentation

## 2019-02-26 DIAGNOSIS — Z87891 Personal history of nicotine dependence: Secondary | ICD-10-CM | POA: Insufficient documentation

## 2019-02-26 MED ORDER — ALBUTEROL SULFATE HFA 108 (90 BASE) MCG/ACT IN AERS
2.0000 | INHALATION_SPRAY | Freq: Once | RESPIRATORY_TRACT | Status: DC
  Filled 2019-02-26: qty 6.7

## 2019-02-26 MED ORDER — KETOROLAC TROMETHAMINE 30 MG/ML IJ SOLN
30.0000 mg | Freq: Once | INTRAMUSCULAR | Status: DC
  Filled 2019-02-26: qty 1

## 2019-02-26 NOTE — ED Triage Notes (Signed)
Pt presents with c/o mid chest pain, non radiating, since last night. Pt states he has a hx of sarcoidosis, states this feels similar to previous flare-ups. Pt denies NV, SOB, lightheadedness. Pt skin arm and dry, in nad. Breathing unlabored.

## 2019-02-26 NOTE — ED Provider Notes (Signed)
Emergency Department Provider Note   I have reviewed the triage vital signs and the nursing notes.   HISTORY  Chief Complaint Chest Pain   HPI Michael Duarte is a 38 y.o. male with PMH of sarcoidosis not on medication, presents to the emergency department for evaluation of chest pain which is been intermittent since last night.  Patient describes 15-minute intervals of nonspecific, substernal chest discomfort.  Radiates slightly to the left.  No shortness of breath.  Denies any productive cough or fever.  Denies history of PE or DVT.  Tells me that he has lost his insurance but that he used to be on methotrexate and prednisone for his sarcoid.  He does not have a regular PCP.   Past Medical History:  Diagnosis Date  . Sarcoidosis of lung (HCC)     There are no active problems to display for this patient.   History reviewed. No pertinent surgical history.   Allergies Patient has no known allergies.  No family history on file.  Social History Social History   Tobacco Use  . Smoking status: Former Smoker    Last attempt to quit: 05/23/2016    Years since quitting: 2.7  . Smokeless tobacco: Never Used  Substance Use Topics  . Alcohol use: Yes    Comment: x3 drinks per week  . Drug use: No    Review of Systems  Constitutional: No fever/chills Eyes: No visual changes. ENT: No sore throat. Cardiovascular: Positive chest pain. Respiratory: Denies shortness of breath. Gastrointestinal: No abdominal pain.  No nausea, no vomiting.  No diarrhea.  No constipation. Genitourinary: Negative for dysuria. Musculoskeletal: Negative for back pain. Skin: Negative for rash. Neurological: Negative for headaches, focal weakness or numbness.  10-point ROS otherwise negative.  ____________________________________________   PHYSICAL EXAM:  VITAL SIGNS: ED Triage Vitals  Enc Vitals Group     BP 02/26/19 1233 (!) 127/91     Pulse Rate 02/26/19 1233 64     Resp 02/26/19 1233  18     Temp 02/26/19 1233 98.7 F (37.1 C)     Temp Source 02/26/19 1233 Oral     SpO2 02/26/19 1233 99 %     Weight 02/26/19 1236 242 lb 8.1 oz (110 kg)     Height 02/26/19 1236 5\' 11"  (1.803 m)     Pain Score 02/26/19 1236 5   Constitutional: Alert and oriented. Well appearing and in no acute distress. Eyes: Conjunctivae are normal.  Head: Atraumatic. Nose: No congestion/rhinnorhea. Mouth/Throat: Mucous membranes are moist.   Neck: No stridor.   Cardiovascular: Normal rate, regular rhythm. Good peripheral circulation. Grossly normal heart sounds.   Respiratory: Normal respiratory effort.  No retractions. Lungs CTAB. Gastrointestinal: Soft and nontender. No distention.  Musculoskeletal: No lower extremity tenderness nor edema. No gross deformities of extremities. Neurologic:  Normal speech and language. No gross focal neurologic deficits are appreciated.  Skin:  Skin is warm, dry and intact. No rash noted.  ____________________________________________   LABS (all labs ordered are listed, but only abnormal results are displayed)  Labs Reviewed - No data to display ____________________________________________  EKG   EKG Interpretation  Date/Time:  Saturday February 26 2019 12:31:17 EST Ventricular Rate:  66 PR Interval:    QRS Duration: 98 QT Interval:  391 QTC Calculation: 410 R Axis:   33 Text Interpretation:  Sinus rhythm Non-specific ST changes. No STEMI.  Confirmed by Alona Bene [phone]) on 02/26/2019 12:37:21 PM       ____________________________________________  RADIOLOGY  None ____________________________________________   PROCEDURES  Procedure(s) performed:   Procedures  None ____________________________________________   INITIAL IMPRESSION / ASSESSMENT AND PLAN / ED COURSE  Pertinent labs & imaging results that were available during my care of the patient were reviewed by me and considered in my medical decision making (see chart for  details).  Patient with history of sarcoidosis presents to the emergency department for evaluation of intermittent chest pain starting last night.  EKG reviewed with no acute findings.  Patient is well-appearing with normal oxygen saturation and otherwise normal vitals.  Exam is non-focal.  Plan for inhaler, plain film, d-dimer, troponin, and reassess.  01:15 PM Made aware by nursing that the patient's room is now empty.  Leads have been removed and patient cannot be found in the department.  Called over to radiology but he is not there.  Plan to hold room in case patient comes back but it appears as if he has eloped.  When I left the room after my evaluation and after discussion of our treatment plan the patient seemed pleased with the plan and care options.   ____________________________________________  FINAL CLINICAL IMPRESSION(S) / ED DIAGNOSES  Final diagnoses:  Precordial chest pain     Note:  This document was prepared using Dragon voice recognition software and may include unintentional dictation errors.  Alona Bene, MD Emergency Medicine    Sadarius Norman, Arlyss Repress, MD 02/26/19 [phone]

## 2019-02-26 NOTE — ED Notes (Signed)
RN went into patients room to obtain blood work and administer medication. Patients gown on the floor and monitor found off. Xray contacted and states have not come to get patient. MD notified.

## 2019-06-05 ENCOUNTER — Encounter (HOSPITAL_COMMUNITY): Payer: Self-pay | Admitting: Emergency Medicine

## 2019-06-05 ENCOUNTER — Ambulatory Visit (HOSPITAL_COMMUNITY)
Admission: EM | Admit: 2019-06-05 | Discharge: 2019-06-05 | Disposition: A | Discharge status: Home / Self Care | Payer: Self-pay

## 2019-06-05 DIAGNOSIS — R200 Anesthesia of skin: Secondary | ICD-10-CM

## 2019-06-05 DIAGNOSIS — R202 Paresthesia of skin: Secondary | ICD-10-CM

## 2019-06-05 MED ORDER — PREDNISONE 20 MG PO TABS: ORAL_TABLET | ORAL | 0 refills | Status: DC

## 2019-06-05 NOTE — Discharge Instructions (Addendum)
Wear brace provided , especially while working and at night.  Apply ice at the end of the day.  Course of steroids. Follow up with orthopedics for persistent symptoms as needed.

## 2019-06-05 NOTE — ED Provider Notes (Signed)
[facility]    CSN: [id number] Arrival date & time: 06/05/19  [place]      History   Chief Complaint Chief Complaint  Patient presents with  . Hand Pain    HPI Michael Duarte is a 38 y.o. male.   Michael Duarte presents with complaints of right hand tingling sensation and shooting pains. Sensation to thumb, pointer and middle finger. Started approximately 3 weeks ago, started a new job approximately 4 weeks ago. 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 Pain is much worse at night, has a hard time sleeping. Now no pain but feels numbness. No specific injury. States he had something similar years ago which went away. No elbow or shoulder pain. Has tried taking ibuprofen which hasn't helped. Hx of sarcoidosis.    ROS per HPI, negative if not otherwise mentioned.      Past Medical History:  Diagnosis Date  . Sarcoidosis of lung ([place])     There are no active problems to display for this patient.   History reviewed. No pertinent surgical history.     Home Medications    Prior to Admission medications   Medication Sig Start Date End Date Taking? Authorizing Provider  ibuprofen (ADVIL,MOTRIN) 600 MG tablet Take 1 tablet (600 mg total) by mouth every 6 (six) hours as needed. 09/01/17   Joy, Helane Gunther, PA-C  predniSONE (DELTASONE) 20 MG tablet 3-3-3-2-2-2-1-1-1 06/05/19   Zigmund Gottron, NP    Family History No family history on file.  Social History Social History   Tobacco Use  . Smoking status: Former Smoker    Quit date: 05/23/2016    Years since quitting: 3.0  . Smokeless tobacco: Never Used  Substance Use Topics  . Alcohol use: Yes    Comment: x3 drinks per week  . Drug use: No     Allergies   Patient has no known allergies.   Review of Systems Review of Systems   Physical Exam Triage Vital Signs ED Triage Vitals  Enc Vitals Group     BP 06/05/19 1600 136/85     Pulse Rate 06/05/19 1600 73   Resp 06/05/19 1600 18     Temp 06/05/19 1600 98.4 F (36.9 C)     Temp src --      SpO2 06/05/19 1600 100 %     Weight --      Height --      Head Circumference --      Peak Flow --      Pain Score 06/05/19 1601 3     Pain Loc --      Pain Edu? --      Excl. in [place]? --    No data found.  Updated Vital Signs BP 136/85   Pulse 73   Temp 98.4 F (36.9 C)   Resp 18   SpO2 100%    Physical Exam Constitutional:      Appearance: He is well-developed.  Cardiovascular:     Rate and Rhythm: Normal rate and regular rhythm.  Pulmonary:     Effort: Pulmonary effort is normal.     Breath sounds: Normal breath sounds.  Musculoskeletal:     Right shoulder: Normal.     Right elbow: Normal.    Right wrist: Normal. He exhibits no tenderness, no bony tenderness, no swelling, no effusion and no crepitus.     Right hand: He exhibits normal range of motion, no tenderness, no bony  tenderness, normal two-point discrimination, normal capillary refill, no deformity, no laceration and no swelling. Decreased sensation noted. Decreased sensation is present in the medial distribution.     Comments: Numbness tingling sensation to thumb, index and middle finger of right hand, gross sensation intact; no pain with ROM of hand or wrist; unable to reproduce symptoms with phalen or tinel; strong radial pulse; no redness or swelling; no point tenderness on palpation   Skin:    General: Skin is warm and dry.  Neurological:     Mental Status: He is alert and oriented to person, place, and time.      UC Treatments / Results  Labs (all labs ordered are listed, but only abnormal results are displayed) Labs Reviewed - No data to display  EKG None  Radiology No results found.  Procedures Procedures (including critical care time)  Medications Ordered in UC Medications - No data to display  Initial Impression / Assessment and Plan / UC Course  I have reviewed the triage vital signs and the nursing  notes.  Pertinent labs & imaging results that were available during my care of the patient were reviewed by me and considered in my medical decision making (see chart for details).     Negative phalen and tinel testing, but with median nerve distribution symptoms s/p repetitive hand and wrist activity while at work. No redness swelling or injury. Wrist splint provided and encouraged to follow up with orthopedics as needed. Patient verbalized understanding and agreeable to plan.    Final Clinical Impressions(s) / UC Diagnoses   Final diagnoses:  Numbness and tingling of right hand     Discharge Instructions     Wear brace provided , especially while working and at night.  Apply ice at the end of the day.  Course of steroids. Follow up with orthopedics for persistent symptoms as needed.    ED Prescriptions    Medication Sig Dispense Auth. Provider   predniSONE (DELTASONE) 20 MG tablet 3-3-3-2-2-2-1-1-1 18 tablet Linus MakoBurky, Arine Foley B, NP     Controlled Substance Prescriptions [place] Controlled Substance Registry consulted? Not Applicable   Georgetta HaberBurky, Zayden Maffei B, NP 06/05/19 1730

## 2019-06-05 NOTE — ED Triage Notes (Signed)
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

## 2019-10-04 ENCOUNTER — Encounter (HOSPITAL_COMMUNITY): Payer: Self-pay

## 2019-10-04 ENCOUNTER — Other Ambulatory Visit: Payer: Self-pay

## 2019-10-04 ENCOUNTER — Ambulatory Visit (HOSPITAL_COMMUNITY)
Admission: EM | Admit: 2019-10-04 | Discharge: 2019-10-04 | Disposition: A | Discharge status: Home / Self Care | Payer: 59 | Attending: Emergency Medicine | Admitting: Emergency Medicine

## 2019-10-04 DIAGNOSIS — R112 Nausea with vomiting, unspecified: Secondary | ICD-10-CM | POA: Diagnosis not present

## 2019-10-04 DIAGNOSIS — R197 Diarrhea, unspecified: Secondary | ICD-10-CM | POA: Diagnosis not present

## 2019-10-04 MED ORDER — ONDANSETRON HCL 4 MG PO TABS: 4.0000 mg | ORAL_TABLET | Freq: Four times a day (QID) | ORAL | 0 refills | Status: DC

## 2019-10-04 NOTE — Discharge Instructions (Addendum)
Take the prescribed medication as needed for nausea and vomiting.    Keep yourself hydrated with clear liquids such as Gatorade, Sprite, ginger ale.    Return here or go to the emergency department if you are unable to keep yourself hydrated or you develop new symptoms such as fever or chills.

## 2019-10-04 NOTE — ED Triage Notes (Signed)
Pt states he ate [employer] this morning 4 am. Pt states he didn't realize he burger was raw until he had ate most of it. Pt cc stomach pains and nausea and vomiting.

## 2019-10-04 NOTE — ED Provider Notes (Signed)
[facility]    CSN: [id number] Arrival date & time: 10/04/19  1412      History   Chief Complaint Chief Complaint  Patient presents with  . Abdominal Pain    HPI Michael Duarte is a 38 y.o. male.   Patient presents with nausea, vomiting, diarrhea since eating a hamburger at 4 AM.  He states he had eaten most of the hamburger before he realized it was raw.  Patient reports 3 episodes of emesis and 2 episodes of diarrhea today.  He denies fever, chills, or other symptoms.  The history is provided by the patient.    Past Medical History:  Diagnosis Date  . Sarcoidosis of lung (HCC)     There are no active problems to display for this patient.   History reviewed. No pertinent surgical history.     Home Medications    Prior to Admission medications   Medication Sig Start Date End Date Taking? Authorizing Provider  ibuprofen (ADVIL,MOTRIN) 600 MG tablet Take 1 tablet (600 mg total) by mouth every 6 (six) hours as needed. 09/01/17   Joy, Shawn C, PA-C  ondansetron (ZOFRAN) 4 MG tablet Take 1 tablet (4 mg total) by mouth every 6 (six) hours. 10/04/19   Mickie Bail, NP  predniSONE (DELTASONE) 20 MG tablet 3-3-3-2-2-2-1-1-1 06/05/19   Georgetta Haber, NP    Family History History reviewed. No pertinent family history.  Social History Social History   Tobacco Use  . Smoking status: Former Smoker    Quit date: 05/23/2016    Years since quitting: 3.3  . Smokeless tobacco: Never Used  Substance Use Topics  . Alcohol use: Yes    Comment: x3 drinks per week  . Drug use: No     Allergies   Patient has no known allergies.   Review of Systems Review of Systems  Constitutional: Negative for chills and fever.  HENT: Negative for ear pain and sore throat.   Eyes: Negative for pain and visual disturbance.  Respiratory: Negative for cough and shortness of breath.   Cardiovascular: Negative for chest pain and palpitations.  Gastrointestinal: Positive for  diarrhea, nausea and vomiting. Negative for abdominal pain.  Genitourinary: Negative for dysuria and hematuria.  Musculoskeletal: Negative for arthralgias and back pain.  Skin: Negative for color change and rash.  Neurological: Negative for seizures and syncope.  All other systems reviewed and are negative.    Physical Exam Triage Vital Signs ED Triage Vitals [10/04/19 1449]  Enc Vitals Group     BP 140/84     Pulse Rate 73     Resp 18     Temp 98.6 F (37 C)     Temp src      SpO2 100 %     Weight 250 lb (113.4 kg)     Height      Head Circumference      Peak Flow      Pain Score 7     Pain Loc      Pain Edu?      Excl. in [place]?    No data found.  Updated Vital Signs BP 140/84 (BP Location: Right Arm)   Pulse 73   Temp 98.6 F (37 C)   Resp 18   Wt 250 lb (113.4 kg)   SpO2 100%   BMI 34.87 kg/m   Visual Acuity Right Eye Distance:   Left Eye Distance:   Bilateral Distance:    Right Eye Near:  Left Eye Near:    Bilateral Near:     Physical Exam Vitals signs and nursing note reviewed.  Constitutional:      General: He is not in acute distress.    Appearance: He is well-developed. He is not ill-appearing.  HENT:     Head: Normocephalic and atraumatic.     Right Ear: Tympanic membrane normal.     Left Ear: Tympanic membrane normal.     Nose: Nose normal.     Mouth/Throat:     Mouth: Mucous membranes are moist.     Pharynx: Oropharynx is clear.  Eyes:     Conjunctiva/sclera: Conjunctivae normal.  Neck:     Musculoskeletal: Neck supple.  Cardiovascular:     Rate and Rhythm: Normal rate and regular rhythm.     Heart sounds: No murmur.  Pulmonary:     Effort: Pulmonary effort is normal. No respiratory distress.     Breath sounds: Normal breath sounds.  Abdominal:     General: Bowel sounds are normal.     Palpations: Abdomen is soft.     Tenderness: There is no abdominal tenderness. There is no guarding or rebound.  Skin:    General: Skin is warm  and dry.     Findings: No rash.  Neurological:     General: No focal deficit present.     Mental Status: He is alert and oriented to person, place, and time.      UC Treatments / Results  Labs (all labs ordered are listed, but only abnormal results are displayed) Labs Reviewed - No data to display  EKG   Radiology No results found.  Procedures Procedures (including critical care time)  Medications Ordered in UC Medications - No data to display  Initial Impression / Assessment and Plan / UC Course  I have reviewed the triage vital signs and the nursing notes.  Pertinent labs & imaging results that were available during my care of the patient were reviewed by me and considered in my medical decision making (see chart for details).    Nausea vomiting and diarrhea.  Treating with Zofran.  Instructed patient to stay hydrated with clear liquids.  Discussed that he should return here or go to the ED if he is unable to stay hydrated or develops new symptoms such as fever or chills.  Patient agrees to plan of care.   Final Clinical Impressions(s) / UC Diagnoses   Final diagnoses:  Nausea vomiting and diarrhea     Discharge Instructions     Take the prescribed medication as needed for nausea and vomiting.    Keep yourself hydrated with clear liquids such as Gatorade, Sprite, ginger ale.    Return here or go to the emergency department if you are unable to keep yourself hydrated or you develop new symptoms such as fever or chills.        ED Prescriptions    Medication Sig Dispense Auth. Provider   ondansetron (ZOFRAN) 4 MG tablet Take 1 tablet (4 mg total) by mouth every 6 (six) hours. 12 tablet Sharion Balloon, NP     PDMP not reviewed this encounter.   Sharion Balloon, NP 10/04/19 [id number]

## 2019-10-21 ENCOUNTER — Ambulatory Visit (HOSPITAL_COMMUNITY)
Admission: EM | Admit: 2019-10-21 | Discharge: 2019-10-21 | Disposition: A | Discharge status: Home / Self Care | Payer: 59 | Attending: Family Medicine | Admitting: Family Medicine

## 2019-10-21 ENCOUNTER — Other Ambulatory Visit: Payer: Self-pay

## 2019-10-21 ENCOUNTER — Encounter (HOSPITAL_COMMUNITY): Payer: Self-pay

## 2019-10-21 DIAGNOSIS — Z20828 Contact with and (suspected) exposure to other viral communicable diseases: Secondary | ICD-10-CM

## 2019-10-21 DIAGNOSIS — Z20822 Contact with and (suspected) exposure to covid-19: Secondary | ICD-10-CM

## 2019-10-21 DIAGNOSIS — U071 COVID-19: Secondary | ICD-10-CM | POA: Diagnosis not present

## 2019-10-21 NOTE — ED Provider Notes (Signed)
[facility]    CSN: [id number] Arrival date & time: 10/21/19  1351      History   Chief Complaint Chief Complaint  Patient presents with  . Covid Testing    HPI Michael Duarte is a 38 y.o. male.   HPI  Patient states his wife is having coronavirus symptoms.  She has loss of taste and smell, coughing, chest congestion.  She has had coronavirus testing but has not been resulted.  He states that he is here to get tested because he has a history of sarcoidosis. Patient is having no symptoms.  No respiratory symptoms.  No fever chills.  No myalgias.  No change in appetite, taste or smell  Past Medical History:  Diagnosis Date  . Sarcoidosis of lung ([place])     There are no active problems to display for this patient.   History reviewed. No pertinent surgical history.     Home Medications    Prior to Admission medications   Medication Sig Start Date End Date Taking? Authorizing Provider  ibuprofen (ADVIL,MOTRIN) 600 MG tablet Take 1 tablet (600 mg total) by mouth every 6 (six) hours as needed. 09/01/17   Joy, Helane Gunther, PA-C    Family History Family History  Family history unknown: Yes    Social History Social History   Tobacco Use  . Smoking status: Former Smoker    Quit date: 05/23/2016    Years since quitting: 3.4  . Smokeless tobacco: Never Used  Substance Use Topics  . Alcohol use: Yes    Comment: x3 drinks per week  . Drug use: No     Allergies   Patient has no known allergies.   Review of Systems Review of Systems  Constitutional: Negative for chills and fever.  HENT: Negative for ear pain and sore throat.   Eyes: Negative for pain and visual disturbance.  Respiratory: Negative for cough and shortness of breath.   Cardiovascular: Negative for chest pain and palpitations.  Gastrointestinal: Negative for abdominal pain and vomiting.  Genitourinary: Negative for dysuria and hematuria.  Musculoskeletal: Negative for arthralgias and back  pain.  Skin: Negative for color change and rash.  Neurological: Negative for seizures and syncope.  All other systems reviewed and are negative.    Physical Exam Triage Vital Signs ED Triage Vitals  Enc Vitals Group     BP 10/21/19 1429 130/79     Pulse Rate 10/21/19 1429 80     Resp 10/21/19 1429 18     Temp 10/21/19 1429 98.2 F (36.8 C)     Temp Source 10/21/19 1429 Oral     SpO2 10/21/19 1429 99 %     Weight --      Height --      Head Circumference --      Peak Flow --      Pain Score 10/21/19 1431 0     Pain Loc --      Pain Edu? --      Excl. in [place]? --    No data found.  Updated Vital Signs BP 130/79 (BP Location: Right Arm)   Pulse 80   Temp 98.2 F (36.8 C) (Oral)   Resp 18   SpO2 99%   Visual Acuity Right Eye Distance:   Left Eye Distance:   Bilateral Distance:    Right Eye Near:   Left Eye Near:    Bilateral Near:     Physical Exam Constitutional:      General:  He is not in acute distress.    Appearance: He is well-developed.  HENT:     Head: Normocephalic and atraumatic.     Mouth/Throat:     Comments: Mask in place Eyes:     Conjunctiva/sclera: Conjunctivae normal.     Pupils: Pupils are equal, round, and reactive to light.  Neck:     Musculoskeletal: Normal range of motion.  Cardiovascular:     Rate and Rhythm: Normal rate.  Pulmonary:     Effort: Pulmonary effort is normal. No respiratory distress.  Abdominal:     General: There is no distension.     Palpations: Abdomen is soft.  Musculoskeletal: Normal range of motion.  Skin:    General: Skin is warm and dry.  Neurological:     Mental Status: He is alert.  Psychiatric:        Mood and Affect: Mood normal.        Behavior: Behavior normal.      UC Treatments / Results  Labs (all labs ordered are listed, but only abnormal results are displayed) Labs Reviewed  NOVEL CORONAVIRUS, NAA (HOSP ORDER, SEND-OUT TO REF LAB; TAT 18-24 HRS)    EKG   Radiology No results  found.  Procedures Procedures (including critical care time)  Medications Ordered in UC Medications - No data to display  Initial Impression / Assessment and Plan / UC Course  I have reviewed the triage vital signs and the nursing notes.  Pertinent labs & imaging results that were available during my care of the patient were reviewed by me and considered in my medical decision making (see chart for details).     Here for laboratory testing.  No symptoms.  Coronavirus discussed. Final Clinical Impressions(s) / UC Diagnoses   Final diagnoses:  Exposure to COVID-19 virus     Discharge Instructions     You may take Tylenol if you develop pain or fever You may use over-the-counter cough and cold preparations if needed Drink plenty of fluids You must quarantine at home until your coronavirus/Covid test is negative   ED Prescriptions    None     PDMP not reviewed this encounter.   Eustace Moore, MD 10/21/19 1520

## 2019-10-21 NOTE — ED Triage Notes (Signed)
Pt presents for covid testing after wife has been sick with covid symptoms.

## 2019-10-21 NOTE — Discharge Instructions (Signed)
You may take Tylenol if you develop pain or fever You may use over-the-counter cough and cold preparations if needed Drink plenty of fluids You must quarantine at home until your coronavirus/Covid test is negative

## 2019-10-23 LAB — NOVEL CORONAVIRUS, NAA (HOSP ORDER, SEND-OUT TO REF LAB; TAT 18-24 HRS): SARS-CoV-2, NAA: DETECTED — AB

## 2019-10-24 ENCOUNTER — Telehealth (HOSPITAL_COMMUNITY): Payer: Self-pay | Admitting: Emergency Medicine

## 2019-10-24 NOTE — Telephone Encounter (Signed)
Covid Positive. Patient contacted and made aware of    results, all questions answered Educated on quarantine times and s/s to watch out for.   

## 2020-07-29 ENCOUNTER — Encounter (HOSPITAL_COMMUNITY): Payer: Self-pay

## 2020-07-29 ENCOUNTER — Ambulatory Visit (HOSPITAL_COMMUNITY)
Admission: EM | Admit: 2020-07-29 | Discharge: 2020-07-29 | Disposition: A | Discharge status: Home / Self Care | Payer: 59 | Attending: Emergency Medicine | Admitting: Emergency Medicine

## 2020-07-29 ENCOUNTER — Other Ambulatory Visit: Payer: Self-pay

## 2020-07-29 DIAGNOSIS — M79604 Pain in right leg: Secondary | ICD-10-CM

## 2020-07-29 DIAGNOSIS — M79605 Pain in left leg: Secondary | ICD-10-CM

## 2020-07-29 DIAGNOSIS — R600 Localized edema: Secondary | ICD-10-CM

## 2020-07-29 LAB — COMPREHENSIVE METABOLIC PANEL
ALT: 25 U/L (ref 0–44)
AST: 20 U/L (ref 15–41)
Albumin: 3.5 g/dL (ref 3.5–5.0)
Alkaline Phosphatase: 100 U/L (ref 38–126)
Anion gap: 11 (ref 5–15)
BUN: 14 mg/dL (ref 6–20)
CO2: 22 mmol/L (ref 22–32)
Calcium: 9.3 mg/dL (ref 8.9–10.3)
Chloride: 104 mmol/L (ref 98–111)
Creatinine, Ser: 1.03 mg/dL (ref 0.61–1.24)
GFR calc Af Amer: >60 mL/min (ref >60)
GFR calc non Af Amer: >60 mL/min (ref >60)
Glucose, Bld: 101 mg/dL — ABNORMAL HIGH (ref 70–99)
Potassium: 3.6 mmol/L (ref 3.5–5.1)
Sodium: 137 mmol/L (ref 135–145)
Total Bilirubin: 0.7 mg/dL (ref 0.3–1.2)
Total Protein: 6.9 g/dL (ref 6.5–8.1)

## 2020-07-29 LAB — CBC WITH DIFFERENTIAL/PLATELET
Abs Immature Granulocytes: 0.02 10*3/uL (ref 0.00–0.07)
Basophils Absolute: 0 10*3/uL (ref 0.0–0.1)
Basophils Relative: 1 %
Eosinophils Absolute: 0.2 10*3/uL (ref 0.0–0.5)
Eosinophils Relative: 3 %
HCT: 40.8 % (ref 39.0–52.0)
Hemoglobin: 13.7 g/dL (ref 13.0–17.0)
Immature Granulocytes: 0 %
Lymphocytes Relative: 23 %
Lymphs Abs: 1.5 10*3/uL (ref 0.7–4.0)
MCH: 30.9 pg (ref 26.0–34.0)
MCHC: 33.6 g/dL (ref 30.0–36.0)
MCV: 91.9 fL (ref 80.0–100.0)
Monocytes Absolute: 0.6 10*3/uL (ref 0.1–1.0)
Monocytes Relative: 9 %
Neutro Abs: 4.2 10*3/uL (ref 1.7–7.7)
Neutrophils Relative %: 64 %
Platelets: 331 10*3/uL (ref 150–400)
RBC: 4.44 MIL/uL (ref 4.22–5.81)
RDW: 11.9 % (ref 11.5–15.5)
WBC: 6.5 10*3/uL (ref 4.0–10.5)
nRBC: 0 % (ref 0.0–0.2)

## 2020-07-29 LAB — CK: Total CK: 117 U/L (ref 49–397)

## 2020-07-29 MED ORDER — DOXYCYCLINE HYCLATE 100 MG PO CAPS
100.0000 mg | ORAL_CAPSULE | Freq: Two times a day (BID) | ORAL | 0 refills | Status: DC
Start: 2020-07-29 — End: 2022-05-27

## 2020-07-29 NOTE — ED Triage Notes (Signed)
Pt presents to UC for pain in calf and shins x2 weeks, and bilateral foot swelling x3 days. Pt states he has had an increase in physical activity and legs have hurt since.   Pt denies OTC meds or relieving factors. Pt ambulated into treatments space with even steady gait. Pt noted to have full range of motion in bilateral LE's. Pt noted to have swelling in left foot.

## 2020-07-29 NOTE — Discharge Instructions (Signed)
I would like you to start wearing compression stockings to help with your swelling, especially during the day and while more active.  Elevate your legs as able.  I am collecting a few blood tests to further evaluate what the source of your symptoms are, although they may not provide a total picture. I will call you if there are any concerning findings.  I would like you to establish care with a primary care provider, especially if your symptoms do not resolve, for recheck, as you may need further evaluation.  Return here or go to the ER if any worsening of your symptoms.

## 2020-07-29 NOTE — ED Provider Notes (Addendum)
[facility]    CSN: [id number] Arrival date & time: 07/29/20  1022      History   Chief Complaint Chief Complaint  Patient presents with  . Leg Pain    HPI Michael Duarte is a 39 y.o. male.   Michael Duarte presents with complaints of bilateral leg pain and swelling. Went to [employer] a few weeks ago, in which he was running playing, and walking more than usual. Once back home noted bilateral shin pain. This progressed to bilateral calf pain, as well as bilateral feet swelling. Two weeks since onset, but symptoms are worse- bilateral shin pain, calf pain, and feet swelling. Feels "knots" to back of legs. No injuries. 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 No medications or treatments tried. Standing all day or walking increases swellnig. Staying off feet helps. When he wakes has minimal swelling. Swelling worse by end of night. No numbness or tingling. No fevers. No chest pain , no cough, no shortness of breath . History of sarcoidosis. No known DM. Doesn't have a PCP.    ROS per HPI, negative if not otherwise mentioned.      Past Medical History:  Diagnosis Date  . Sarcoidosis of lung (HCC)     There are no problems to display for this patient.   History reviewed. No pertinent surgical history.     Home Medications    Prior to Admission medications   Medication Sig Start Date End Date Taking? Authorizing Provider  doxycycline (VIBRAMYCIN) 100 MG capsule Take 1 capsule (100 mg total) by mouth 2 (two) times daily. 07/29/20   Georgetta Haber, NP  ibuprofen (ADVIL,MOTRIN) 600 MG tablet Take 1 tablet (600 mg total) by mouth every 6 (six) hours as needed. 09/01/17   Joy, Hillard Danker, PA-C    Family History Family History  Family history unknown: Yes    Social History Social History   Tobacco Use  . Smoking status: Former Smoker    Quit date: 05/23/2016    Years since quitting: 4.1  . Smokeless  tobacco: Never Used  Substance Use Topics  . Alcohol use: Yes    Comment: x3 drinks per week  . Drug use: No     Allergies   Patient has no known allergies.   Review of Systems Review of Systems   Physical Exam Triage Vital Signs ED Triage Vitals  Enc Vitals Group     BP 07/29/20 1047 130/81     Pulse Rate 07/29/20 1047 74     Resp 07/29/20 1047 16     Temp 07/29/20 1047 98.3 F (36.8 C)     Temp src --      SpO2 07/29/20 1047 98 %     Weight --      Height --      Head Circumference --      Peak Flow --      Pain Score 07/29/20 1048 0     Pain Loc --      Pain Edu? --      Excl. in GC? --    No data found.  Updated Vital Signs BP 130/81   Pulse 74   Temp 98.3 F (36.8 C)   Resp 16   SpO2 98%   Visual Acuity Right Eye Distance:   Left Eye Distance:   Bilateral Distance:    Right Eye Near:   Left Eye  Near:    Bilateral Near:     Physical Exam Constitutional:      Appearance: He is well-developed. He is not ill-appearing or toxic-appearing.  Cardiovascular:     Rate and Rhythm: Normal rate.  Pulmonary:     Effort: Pulmonary effort is normal.  Musculoskeletal:     Comments: Bilateral lower legs with +1-+2 non pitting edema, primarily to feet/ ankles; diffuse redness although noted areas to chins and calves of hyperpigmented lesions which are more tender, some are firm feeling; no vesicles; see photo of left shin- similar lesions to calves bilaterally; strength equal bilaterally; gross sensation intact to lower legs; strong pedal pulses   Skin:    General: Skin is warm and dry.  Neurological:     Mental Status: He is alert and oriented to person, place, and time.        UC Treatments / Results  Labs (all labs ordered are listed, but only abnormal results are displayed) Labs Reviewed  CBC WITH DIFFERENTIAL/PLATELET  COMPREHENSIVE METABOLIC PANEL  CK    EKG   Radiology No results found.  Procedures Procedures (including critical care  time)  Medications Ordered in UC Medications - No data to display  Initial Impression / Assessment and Plan / UC Course  I have reviewed the triage vital signs and the nursing notes.  Pertinent labs & imaging results that were available during my care of the patient were reviewed by me and considered in my medical decision making (see chart for details).     Edema with some redness, tenderness, lesions as well. Peripheral edema vs cellulitis vs myositis considered. Some baseline labs and CK obtained today, doxycycline initiated. Encouraged supportive measures such as compression and elevation, with strict return precautions and emphasized follow up as may need additional evaluation if persistent. Patient verbalized understanding and agreeable to plan.  On chart review has had leg rash in the past, which was evaluated by rhuematology with concern for sarcoid relationship  Final Clinical Impressions(s) / UC Diagnoses   Final diagnoses:  Bilateral leg pain  Lower extremity edema     Discharge Instructions     I would like you to start wearing compression stockings to help with your swelling, especially during the day and while more active.  Elevate your legs as able.  I am collecting a few blood tests to further evaluate what the source of your symptoms are, although they may not provide a total picture. I will call you if there are any concerning findings.  I would like you to establish care with a primary care provider, especially if your symptoms do not resolve, for recheck, as you may need further evaluation.  Return here or go to the ER if any worsening of your symptoms.     ED Prescriptions    Medication Sig Dispense Auth. Provider   doxycycline (VIBRAMYCIN) 100 MG capsule Take 1 capsule (100 mg total) by mouth 2 (two) times daily. 20 capsule Georgetta Haber, NP     PDMP not reviewed this encounter.   Georgetta Haber, NP 07/29/20 1130    Georgetta Haber, NP 07/29/20  1133

## 2020-10-21 ENCOUNTER — Encounter (HOSPITAL_COMMUNITY): Payer: Self-pay

## 2020-10-21 ENCOUNTER — Other Ambulatory Visit: Payer: Self-pay

## 2020-10-21 ENCOUNTER — Ambulatory Visit (HOSPITAL_COMMUNITY)
Admission: EM | Admit: 2020-10-21 | Discharge: 2020-10-21 | Disposition: A | Discharge status: Home / Self Care | Payer: 59 | Attending: Family Medicine | Admitting: Family Medicine

## 2020-10-21 DIAGNOSIS — M5441 Lumbago with sciatica, right side: Secondary | ICD-10-CM | POA: Diagnosis not present

## 2020-10-21 DIAGNOSIS — R109 Unspecified abdominal pain: Secondary | ICD-10-CM | POA: Diagnosis not present

## 2020-10-21 DIAGNOSIS — M549 Dorsalgia, unspecified: Secondary | ICD-10-CM

## 2020-10-21 DIAGNOSIS — M5442 Lumbago with sciatica, left side: Secondary | ICD-10-CM

## 2020-10-21 LAB — POCT URINALYSIS DIPSTICK, ED / UC
Bilirubin Urine: NEGATIVE
Glucose, UA: NEGATIVE mg/dL
Hgb urine dipstick: NEGATIVE
Ketones, ur: NEGATIVE mg/dL
Leukocytes,Ua: NEGATIVE
Nitrite: NEGATIVE
Protein, ur: NEGATIVE mg/dL
Specific Gravity, Urine: 1.02 (ref 1.005–1.030)
Urobilinogen, UA: 0.2 mg/dL (ref 0.0–1.0)
pH: 5.5 (ref 5.0–8.0)

## 2020-10-21 MED ORDER — CYCLOBENZAPRINE HCL 5 MG PO TABS
5.0000 mg | ORAL_TABLET | Freq: Two times a day (BID) | ORAL | 0 refills | Status: DC | PRN
Start: 2020-10-21 — End: 2022-05-20

## 2020-10-21 MED ORDER — NAPROXEN 500 MG PO TABS: 500.0000 mg | ORAL_TABLET | Freq: Two times a day (BID) | ORAL | 0 refills | Status: DC

## 2020-10-21 NOTE — ED Provider Notes (Signed)
[facility]    CSN: [id number] Arrival date & time: 10/21/20  1349      History   Chief Complaint Chief Complaint  Patient presents with  . Back Pain  . Flank Pain    HPI Michael Duarte is a 39 y.o. male.   HPI Patient presents with 2 to 3 weeks of intermittent bilateral lumbar sacral pain.  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  He denies any known injury pain comes and goes.  He endorses some pain radiating from his low back into his legs intermittently.  He has taken a few doses of ibuprofen but nothing consistently.  He was concerned that the pain may be related to a bladder issue.  He denies any hematuria or problems urinating.  No dysuria. Past Medical History:  Diagnosis Date  . Sarcoidosis of lung (HCC)     There are no problems to display for this patient.   History reviewed. No pertinent surgical history.     Home Medications    Prior to Admission medications   Medication Sig Start Date End Date Taking? Authorizing Provider  doxycycline (VIBRAMYCIN) 100 MG capsule Take 1 capsule (100 mg total) by mouth 2 (two) times daily. 07/29/20   Georgetta Haber, NP  ibuprofen (ADVIL,MOTRIN) 600 MG tablet Take 1 tablet (600 mg total) by mouth every 6 (six) hours as needed. 09/01/17   Joy, Hillard Danker, PA-C    Family History Family History  Family history unknown: Yes    Social History Social History   Tobacco Use  . Smoking status: Former Smoker    Quit date: 05/23/2016    Years since quitting: 4.4  . Smokeless tobacco: Never Used  Substance Use Topics  . Alcohol use: Yes    Comment: x3 drinks per week  . Drug use: No     Allergies   Patient has no known allergies.   Review of Systems Review of Systems Pertinent negatives listed in HPI Physical Exam Triage Vital Signs ED Triage Vitals [10/21/20 1448]  Enc Vitals Group     BP 135/75     Pulse Rate 68     Resp 16     Temp 98.9  F (37.2 C)     Temp Source Oral     SpO2 98 %     Weight      Height      Head Circumference      Peak Flow      Pain Score 3     Pain Loc      Pain Edu?      Excl. in GC?    No data found.  Updated Vital Signs BP 135/75 (BP Location: Right Arm)   Pulse 68   Temp 98.9 F (37.2 C) (Oral)   Resp 16   SpO2 98%   Visual Acuity Right Eye Distance:   Left Eye Distance:   Bilateral Distance:    Right Eye Near:   Left Eye Near:    Bilateral Near:     Physical Exam Constitutional:      Appearance: Normal appearance. He is obese.  HENT:     Head: Normocephalic.  Cardiovascular:     Rate and Rhythm: Normal rate.  Pulmonary:     Effort: Pulmonary effort is normal.     Breath sounds: Normal breath sounds.  Abdominal:     Tenderness: There is right CVA  tenderness. There is no left CVA tenderness.  Musculoskeletal:        General: Normal range of motion.  Skin:    Capillary Refill: Capillary refill takes less than 2 seconds.  Neurological:     General: No focal deficit present.     Mental Status: He is alert.     Gait: Gait normal.  Psychiatric:        Mood and Affect: Mood normal.        Behavior: Behavior normal.        Thought Content: Thought content normal.        Judgment: Judgment normal.      UC Treatments / Results  Labs (all labs ordered are listed, but only abnormal results are displayed) Labs Reviewed - No data to display  EKG   Radiology No results found.  Procedures Procedures (including critical care time)  Medications Ordered in UC Medications - No data to display  Initial Impression / Assessment and Plan / UC Course  I have reviewed the triage vital signs and the nursing notes.  Pertinent labs & imaging results that were available during my care of the patient were reviewed by me and considered in my medical decision making (see chart for details).     UA unremarkable.  Treating for acute low back pain with bilateral intermittent  sciatica.  Naproxen 500 mg twice daily as needed and cyclobenzaprine twice daily as needed for pain.  Encouraged application of heat to reduce inflammation.  If symptoms worsen or do not improve follow-up with primary care provider.  Patient verbalized understanding and agreement with plan.  Final Clinical Impressions(s) / UC Diagnoses   Final diagnoses:  Acute bilateral low back pain with bilateral sciatica     Discharge Instructions     Your urine results are completely normal therefore treating you as discussed for acute back pain.  I prescribed naproxen for you to take twice daily as needed for pain and the muscle relaxer he can take at bedtime or twice daily when at home and not driving as medication can cause drowsiness.  Also recommend applying heat to your low back with like a heating pad this will also help improve inflammation.  If symptoms worsen or do not improve follow-up with your primary care provider.    ED Prescriptions    Medication Sig Dispense Auth. Provider   cyclobenzaprine (FLEXERIL) 5 MG tablet Take 1 tablet (5 mg total) by mouth 2 (two) times daily as needed for muscle spasms. 30 tablet Bing Neighbors, FNP   naproxen (NAPROSYN) 500 MG tablet Take 1 tablet (500 mg total) by mouth 2 (two) times daily with a meal. 30 tablet Bing Neighbors, FNP     PDMP not reviewed this encounter.   Bing Neighbors, FNP 10/21/20 1547

## 2020-10-21 NOTE — ED Triage Notes (Signed)
Pt present lower back and flank pain. Symptoms started 3 days ago. Pt states the pain in the area of his kidney and is very aching.

## 2020-10-21 NOTE — Discharge Instructions (Signed)
Your urine results are completely normal therefore treating you as discussed for acute back pain.  I prescribed naproxen for you to take twice daily as needed for pain and the muscle relaxer he can take at bedtime or twice daily when at home and not driving as medication can cause drowsiness.  Also recommend applying heat to your low back with like a heating pad this will also help improve inflammation.  If symptoms worsen or do not improve follow-up with your primary care provider.

## 2021-08-31 ENCOUNTER — Other Ambulatory Visit: Payer: Self-pay

## 2021-08-31 ENCOUNTER — Encounter (HOSPITAL_COMMUNITY): Payer: Self-pay | Admitting: Emergency Medicine

## 2021-08-31 ENCOUNTER — Emergency Department (HOSPITAL_COMMUNITY)
Admission: EM | Admit: 2021-08-31 | Discharge: 2021-08-31 | Disposition: A | Discharge status: Home / Self Care | Payer: Self-pay | Attending: Emergency Medicine | Admitting: Emergency Medicine

## 2021-08-31 ENCOUNTER — Emergency Department (HOSPITAL_COMMUNITY): Payer: Self-pay

## 2021-08-31 DIAGNOSIS — U071 COVID-19: Secondary | ICD-10-CM | POA: Insufficient documentation

## 2021-08-31 DIAGNOSIS — Z87891 Personal history of nicotine dependence: Secondary | ICD-10-CM | POA: Insufficient documentation

## 2021-08-31 DIAGNOSIS — J069 Acute upper respiratory infection, unspecified: Secondary | ICD-10-CM

## 2021-08-31 MED ORDER — PREDNISONE 50 MG PO TABS: 50.0000 mg | ORAL_TABLET | Freq: Every day | ORAL | 0 refills | Status: DC

## 2021-08-31 MED ORDER — ALBUTEROL SULFATE HFA 108 (90 BASE) MCG/ACT IN AERS
1.0000 | INHALATION_SPRAY | Freq: Four times a day (QID) | RESPIRATORY_TRACT | 0 refills | Status: AC | PRN
Start: 2021-08-31 — End: ?

## 2021-08-31 NOTE — ED Triage Notes (Signed)
Patient coming from home complaint of cough x3 days. Pt with multiple negative at home covid tests.

## 2021-08-31 NOTE — Discharge Instructions (Addendum)
Take the steroids and use the inhaler as needed  Follow-up with your results of your COVID test on MyChart  Follow-up with pulmonology for symptoms do not improve  Return to ED for worsening symptoms

## 2021-08-31 NOTE — ED Notes (Addendum)
Pt up to desk from outside asking if he has been called for a room yet. Pt moved back on the floor

## 2021-08-31 NOTE — ED Notes (Signed)
Patient transported to X-ray 

## 2021-08-31 NOTE — ED Provider Notes (Signed)
[facility] EMERGENCY DEPARTMENT Provider Note   CSN: [id number] Arrival date & time: 08/31/21  1355    History Chief Complaint  Patient presents with   Cough    Michael Duarte is a 40 y.o. male with history significant for sarcoidosis who presents for evaluation of cough.  Cough over the last 3 days.  Initially nonproductive however is now productive of light green sputum.  He has some shortness of breath when he coughs however no exertional or pleuritic CP, SOB.  No prior history of PE or DVT.  No fever, chills, nausea, vomiting, congestion, rhinorrhea, postnasal drip, hemoptysis, abdominal pain, diarrhea, dysuria.  No new lateral leg swelling, redness or warmth.  Has previously needed steroids and albuterol for similar symptoms in the past.  Denies additional aggravating or alleviating factors.  Has taken 3 at home COVID test which were negative no known exposures.  History obtained from patient and past medical records.  No interpreter used.  HPI     Past Medical History:  Diagnosis Date   Sarcoidosis of lung (HCC)     There are no problems to display for this patient.   History reviewed. No pertinent surgical history.     Family History  Family history unknown: Yes    Social History   Tobacco Use   Smoking status: Former    Types: Cigarettes    Quit date: 05/23/2016    Years since quitting: 5.2   Smokeless tobacco: Never  Substance Use Topics   Alcohol use: Yes    Comment: x3 drinks per week   Drug use: No    Home Medications Prior to Admission medications   Medication Sig Start Date End Date Taking? Authorizing Provider  albuterol (VENTOLIN HFA) 108 (90 Base) MCG/ACT inhaler Inhale 1-2 puffs into the lungs every 6 (six) hours as needed for wheezing or shortness of breath. 08/31/21  Yes Leonor Darnell A, PA-C  predniSONE (DELTASONE) 50 MG tablet Take 1 tablet (50 mg total) by mouth daily. 08/31/21  Yes Melissia Lahman A, PA-C   cyclobenzaprine (FLEXERIL) 5 MG tablet Take 1 tablet (5 mg total) by mouth 2 (two) times daily as needed for muscle spasms. 10/21/20   Bing Neighbors, FNP  doxycycline (VIBRAMYCIN) 100 MG capsule Take 1 capsule (100 mg total) by mouth 2 (two) times daily. 07/29/20   Georgetta Haber, NP  ibuprofen (ADVIL,MOTRIN) 600 MG tablet Take 1 tablet (600 mg total) by mouth every 6 (six) hours as needed. 09/01/17   Joy, Shawn C, PA-C  naproxen (NAPROSYN) 500 MG tablet Take 1 tablet (500 mg total) by mouth 2 (two) times daily with a meal. 10/21/20   Bing Neighbors, FNP    Allergies    Patient has no known allergies.  Review of Systems   Review of Systems  Constitutional: Negative.   HENT: Negative.    Respiratory:  Positive for cough. Negative for apnea, choking, chest tightness, shortness of breath, wheezing and stridor.   Cardiovascular: Negative.   Gastrointestinal: Negative.   Genitourinary: Negative.   Musculoskeletal: Negative.   Skin: Negative.   Neurological: Negative.   All other systems reviewed and are negative.  Physical Exam Updated Vital Signs BP 118/78   Pulse 88   Temp 98.7 F (37.1 C) (Oral)   Resp 15   Ht 5\' 11"  (1.803 m)   Wt 124.7 kg   SpO2 99%   BMI 38.35 kg/m   Physical Exam Vitals and nursing note reviewed.  Constitutional:  General: He is not in acute distress.    Appearance: He is well-developed. He is not ill-appearing, toxic-appearing or diaphoretic.  HENT:     Head: Normocephalic and atraumatic.  Eyes:     Pupils: Pupils are equal, round, and reactive to light.  Cardiovascular:     Rate and Rhythm: Normal rate and regular rhythm.     Pulses: Normal pulses.     Heart sounds: Normal heart sounds.  Pulmonary:     Effort: Pulmonary effort is normal. No respiratory distress.     Comments: Expiratory wheeze, speaks in full sentences without difficulty Abdominal:     General: Bowel sounds are normal. There is no distension.     Palpations:  Abdomen is soft.     Tenderness: There is no abdominal tenderness.  Musculoskeletal:        General: Normal range of motion.     Cervical back: Normal range of motion and neck supple.     Right lower leg: No edema.     Left lower leg: No edema.  Skin:    General: Skin is warm and dry.     Capillary Refill: Capillary refill takes less than 2 seconds.  Neurological:     General: No focal deficit present.     Mental Status: He is alert and oriented to person, place, and time.    ED Results / Procedures / Treatments   Labs (all labs ordered are listed, but only abnormal results are displayed) Labs Reviewed  SARS CORONAVIRUS 2 (TAT 6-24 HRS)    EKG None  Radiology DG Chest 2 View  Result Date: 08/31/2021 CLINICAL DATA:  Cough for 3 days EXAM: CHEST - 2 VIEW COMPARISON:  04/21/2015, 01/09/2018 FINDINGS: No acute airspace disease or effusion. Mild bilateral hilar distortion likely due to history of sarcoid. Normal cardiac size. No pneumothorax IMPRESSION: No acute airspace disease. Subtle hilar distortion likely related to history of sarcoid Electronically Signed   By: Jasmine Pang M.D.   On: 08/31/2021 18:58    Procedures Procedures   Medications Ordered in ED Medications - No data to display  ED Course  I have reviewed the triage vital signs and the nursing notes.  Pertinent labs & imaging results that were available during my care of the patient were reviewed by me and considered in my medical decision making (see chart for details).  Here for evaluation of cough for the last 3 days.  Afebrile, nonseptic, not ill-appearing.  No clinical evidence of DVT on exam.  Does not appear grossly fluid overloaded.  He is PERC negative, Wells criteria low risk.  Heart clear.  Does have diffuse expiratory wheeze however no respiratory distress.  Abdomen soft, nontender.  Chest x-ray without cardiomegaly, pulm edema, pneumothorax, infiltrates.  Will get outpatient COVID test  In  meantime we will treat for bronchitis, likely viral in nature.  Discussed close follow-up with pulmonology, PCP.  Patient agreeable.  We will follow-up with results on MyChart.  The patient has been appropriately medically screened and/or stabilized in the ED. I have low suspicion for any other emergent medical condition which would require further screening, evaluation or treatment in the ED or require inpatient management.  Patient is hemodynamically stable and in no acute distress.  Patient able to ambulate in department prior to ED.  Evaluation does not show acute pathology that would require ongoing or additional emergent interventions while in the emergency department or further inpatient treatment.  I have discussed the diagnosis with the  patient and answered all questions.  Pain is been managed while in the emergency department and patient has no further complaints prior to discharge.  Patient is comfortable with plan discussed in room and is stable for discharge at this time.  I have discussed strict return precautions for returning to the emergency department.  Patient was encouraged to follow-up with PCP/specialist refer to at discharge.     MDM Rules/Calculators/A&P                           Wendle Kina was evaluated in Emergency Department on 08/31/2021 for the symptoms described in the history of present illness. He was evaluated in the context of the global COVID-19 pandemic, which necessitated consideration that the patient might be at risk for infection with the SARS-CoV-2 virus that causes COVID-19. Institutional protocols and algorithms that pertain to the evaluation of patients at risk for COVID-19 are in a state of rapid change based on information released by regulatory bodies including the CDC and federal and state organizations. These policies and algorithms were followed during the patient's care in the ED.  Final Clinical Impression(s) / ED Diagnoses Final diagnoses:  Viral  URI with cough    Rx / DC Orders ED Discharge Orders          Ordered    predniSONE (DELTASONE) 50 MG tablet  Daily        08/31/21 2002    albuterol (VENTOLIN HFA) 108 (90 Base) MCG/ACT inhaler  Every 6 hours PRN        08/31/21 2002             Shauntel Prest A, PA-C 08/31/21 2023    Pricilla Loveless, MD 08/31/21 2328

## 2021-08-31 NOTE — ED Notes (Signed)
Pt called for room x3 with no resposne

## 2021-09-01 LAB — SARS CORONAVIRUS 2 (TAT 6-24 HRS): SARS Coronavirus 2: POSITIVE — AB

## 2022-04-13 ENCOUNTER — Emergency Department (HOSPITAL_COMMUNITY)
Admission: EM | Admit: 2022-04-13 | Discharge: 2022-04-13 | Disposition: A | Discharge status: Home / Self Care | Payer: Self-pay | Attending: Emergency Medicine | Admitting: Emergency Medicine

## 2022-04-13 ENCOUNTER — Other Ambulatory Visit: Payer: Self-pay

## 2022-04-13 ENCOUNTER — Encounter (HOSPITAL_COMMUNITY): Payer: Self-pay

## 2022-04-13 DIAGNOSIS — H6122 Impacted cerumen, left ear: Secondary | ICD-10-CM | POA: Insufficient documentation

## 2022-04-13 MED ORDER — CARBAMIDE PEROXIDE 6.5 % OT SOLN
10.0000 [drp] | Freq: Once | OTIC | Status: AC
  Administered 2022-04-13: 10 [drp] via OTIC
  Filled 2022-04-13: qty 15

## 2022-04-13 MED ORDER — CARBAMIDE PEROXIDE 6.5 % OT SOLN: 5.0000 [drp] | Freq: Two times a day (BID) | OTIC | 0 refills | Status: AC

## 2022-04-13 NOTE — ED Notes (Signed)
Ear irrigated with 60cc NS. No ear wax noted. PA aware. ?

## 2022-04-13 NOTE — ED Provider Notes (Signed)
Accepted handoff at shift change from [facility]. Please see prior provider note for more detail.  ? ?Briefly: Patient is 41 y.o. male with a history of sarcoidosis on steroids and methotrexate who presents to the emergency department with complaints of intermittent left ear pain for the past 1 week.  No alleviating or aggravating factors.  Tried taking ibuprofen and Tylenol without relief.  Denies fever, chills, ear drainage, hearing loss, congestion, or cough ? ?Trial of 1:1 hydrogen peroxide without relief of cerumen.  ? ?DDX: concern for ear impaction ? ?Plan: Trial Debrox for ear wax removal ? ?Physical Exam  ?BP 131/87   Pulse 80   Temp 98.4 ?F (36.9 ?C) (Oral)   Resp 18   SpO2 97%  ? ?Physical Exam ?Vitals and nursing note reviewed.  ?Constitutional:   ?   General: He is not in acute distress. ?   Appearance: Normal appearance. He is well-developed. He is not ill-appearing, toxic-appearing or diaphoretic.  ?HENT:  ?   Head: Normocephalic and atraumatic.  ?   Right Ear: External ear normal. Drainage present. No mastoid tenderness.  ?   Ears:  ?   Comments: Drainage from otic drops still present. Ear wax obstruction present, but softened at this point. Still unable to visualize TM.  ?   Nose: No nasal deformity.  ?   Mouth/Throat:  ?   Lips: Pink. No lesions.  ?Eyes:  ?   General: Gaze aligned appropriately. No scleral icterus.    ?   Right eye: No discharge.     ?   Left eye: No discharge.  ?   Conjunctiva/sclera: Conjunctivae normal.  ?   Right eye: Right conjunctiva is not injected. No exudate or hemorrhage. ?   Left eye: Left conjunctiva is not injected. No exudate or hemorrhage. ?Pulmonary:  ?   Effort: Pulmonary effort is normal. No respiratory distress.  ?Skin: ?   General: Skin is warm and dry.  ?Neurological:  ?   Mental Status: He is alert and oriented to person, place, and time.  ?Psychiatric:     ?   Mood and Affect: Mood normal.     ?   Speech: Speech normal.     ?   Behavior: Behavior  normal. Behavior is cooperative.  ? ? ?Procedures  ?Marland KitchenEar Cerumen Removal ? ?Date/Time: 04/13/2022 9:24 AM ?Performed by: Claudie Leach, PA-C ?Authorized by: Claudie Leach, PA-C  ? ?Consent:  ?  Consent obtained:  Verbal ?  Consent given by:  Patient ?  Risks, benefits, and alternatives were discussed: yes   ?  Risks discussed:  Bleeding, incomplete removal, pain, infection, TM perforation and dizziness ?  Alternatives discussed:  No treatment and alternative treatment ?Universal protocol:  ?  Procedure explained and questions answered to patient or proxy's satisfaction: yes   ?  Patient identity confirmed:  Verbally with patient ?Procedure details:  ?  Location:  L ear ?  Procedure type: curette   ?  Procedure type comment:  Currette ?  Procedure outcomes: unable to remove cerumen   ?Post-procedure details:  ?  Inspection:  Some cerumen remaining ?  Hearing quality:  Normal ?  Procedure completion:  Procedure terminated at patient's request ?Comments:  ?   Patient did not tolerate procedure well. Ear wax is softened at this point, but not resolved. Terminated the procedure due to intolerance of patient.  ? ?ED Course / MDM  ?  ?Medical Decision Making ?Risk ?OTC drugs. ? ? ?  Trialed Debrox. On reassessment, obstruction is still present but ear wax is softened. I attempted curette manual disimpaction, but patient did not tolerate this well. Offered patient 4 day trial of ear wax drops to try to gradually soften it over the next few days. Symptoms actually improved on my assessment, so I think patient can be discharged home with supportive treatment. He will be given follow up with ENT if continued symptoms. Return if development of fevers, chills, or worsening symptoms.  ? ? ? ? ?  ?Claudie Leach, PA-C ?04/13/22 2637 ? ?  ?Linwood Dibbles, MD ?04/16/22 1509 ? ?

## 2022-04-13 NOTE — ED Provider Notes (Signed)
?[facility] EMERGENCY DEPARTMENT ?Provider Note ? ? ?CSN: [id number] ?Arrival date & time: 04/13/22  2683 ? ?  ? ?History ? ?Chief Complaint  ?Patient presents with  ? Otalgia  ? ? ?Duy Lemming is a 41 y.o. male with a history of sarcoidosis on steroids and methotrexate who presents to the emergency department with complaints of intermittent left ear pain for the past 1 week.  No alleviating or aggravating factors.  Tried taking ibuprofen and Tylenol without relief.  Denies fever, chills, ear drainage, hearing loss, congestion, or cough ? ?HPI ? ?  ? ?Home Medications ?Prior to Admission medications   ?Medication Sig Start Date End Date Taking? Authorizing Provider  ?albuterol (VENTOLIN HFA) 108 (90 Base) MCG/ACT inhaler Inhale 1-2 puffs into the lungs every 6 (six) hours as needed for wheezing or shortness of breath. 08/31/21   Henderly, Britni A, PA-C  ?cyclobenzaprine (FLEXERIL) 5 MG tablet Take 1 tablet (5 mg total) by mouth 2 (two) times daily as needed for muscle spasms. 10/21/20   Bing Neighbors, FNP  ?doxycycline (VIBRAMYCIN) 100 MG capsule Take 1 capsule (100 mg total) by mouth 2 (two) times daily. 07/29/20   Georgetta Haber, NP  ?ibuprofen (ADVIL,MOTRIN) 600 MG tablet Take 1 tablet (600 mg total) by mouth every 6 (six) hours as needed. 09/01/17   Joy, Shawn C, PA-C  ?naproxen (NAPROSYN) 500 MG tablet Take 1 tablet (500 mg total) by mouth 2 (two) times daily with a meal. 10/21/20   Bing Neighbors, FNP  ?predniSONE (DELTASONE) 50 MG tablet Take 1 tablet (50 mg total) by mouth daily. 08/31/21   Henderly, Britni A, PA-C  ?   ? ?Allergies    ?Patient has no known allergies.   ? ?Review of Systems   ?Review of Systems  ?Constitutional:  Negative for chills and fever.  ?HENT:  Positive for ear pain. Negative for congestion, ear discharge, facial swelling, hearing loss and sore throat.   ?Respiratory:  Negative for cough and shortness of breath.   ?Cardiovascular:  Negative for chest pain.   ?Gastrointestinal:  Negative for abdominal pain.  ?All other systems reviewed and are negative. ? ?Physical Exam ?Updated Vital Signs ?BP 131/87   Pulse 80   Temp 98.4 ?F (36.9 ?C) (Oral)   Resp 18   SpO2 97%  ?Physical Exam ?Vitals and nursing note reviewed.  ?Constitutional:   ?   General: He is not in acute distress. ?   Appearance: He is well-developed. He is not toxic-appearing.  ?HENT:  ?   Head: Normocephalic and atraumatic.  ?   Right Ear: Ear canal normal. Tympanic membrane is not perforated, erythematous, retracted or bulging.  ?   Ears:  ?   Comments: cerumen impaction of the left EAC.  Unable to visualize TM currently. ?No mastoid erythema/swellng/tenderness.  ?   Nose:  ?   Right Sinus: No maxillary sinus tenderness or frontal sinus tenderness.  ?   Left Sinus: No maxillary sinus tenderness or frontal sinus tenderness.  ?   Mouth/Throat:  ?   Pharynx: Oropharynx is clear. Uvula midline. No oropharyngeal exudate or posterior oropharyngeal erythema.  ?   Comments: Posterior oropharynx is symmetric appearing. Patient tolerating own secretions without difficulty. No trismus. No drooling. No hot potato voice. No swelling beneath the tongue, submandibular compartment is soft.  ?Eyes:  ?   General:     ?   Right eye: No discharge.     ?   Left  eye: No discharge.  ?   Conjunctiva/sclera: Conjunctivae normal.  ?Cardiovascular:  ?   Rate and Rhythm: Normal rate and regular rhythm.  ?Pulmonary:  ?   Effort: Pulmonary effort is normal. No respiratory distress.  ?   Breath sounds: Normal breath sounds. No wheezing, rhonchi or rales.  ?Abdominal:  ?   General: There is no distension.  ?   Palpations: Abdomen is soft.  ?   Tenderness: There is no abdominal tenderness.  ?Musculoskeletal:  ?   Cervical back: Neck supple. No rigidity.  ?Lymphadenopathy:  ?   Cervical: No cervical adenopathy.  ?Skin: ?   General: Skin is warm and dry.  ?   Findings: No rash.  ?Neurological:  ?   Mental Status: He is alert.   ?Psychiatric:     ?   Behavior: Behavior normal.  ? ? ?ED Results / Procedures / Treatments   ?Labs ?(all labs ordered are listed, but only abnormal results are displayed) ?Labs Reviewed - No data to display ? ?EKG ?None ? ?Radiology ?No results found. ? ?Procedures ?Procedures  ? ? ?Medications Ordered in ED ?Medications - No data to display ? ?ED Course/ Medical Decision Making/ A&P ?  ?                        ?Medical Decision Making ? ?Patient presents to the emergency department with complaints of left ear pain for the past 1 week intermittently.  Nontoxic, resting comfortably, vitals without significant abnormality.  On initial exam patient has findings of a cerumen impaction in the left ear.  Plan for ear irrigation, risks/benefits discussed with the patient who is agreeable. ? ?No improvement S/p irrigation.  ?Placed 1:1 water: hydrogen peroxide solution in patient's ear, remain unable to remove cerumen.  Will trial Debrox. ? ?Patient care signed out to PA Loeffler @ shift change pending re-assessment.  ? ?Final Clinical Impression(s) / ED Diagnoses ?Final diagnoses:  ?None  ? ? ?Rx / DC Orders ?ED Discharge Orders   ? ? None  ? ?  ? ? ?  ?Cherly Anderson, [place] ?04/13/22 3976 ? ?  ?Sabas Sous, MD ?04/14/22 [phone] ? ?

## 2022-04-13 NOTE — ED Triage Notes (Signed)
Pt reports left ear pain.Pt denies any drainage. Pt denies sore throat, fevers.Pt reports he think he might have an ear infections.  ?

## 2022-04-13 NOTE — Discharge Instructions (Addendum)
Please use the otic drops that we prescribed for you for no longer than three days. If still continuing to have problems with ear pain, please follow up with ENT provider listed in your discharge paperwork. If you develop fevers, chills, worsening ear symptoms, please return to the Emergency department. ?

## 2022-04-13 NOTE — ED Notes (Signed)
Pt verbalizes understanding of discharge instructions. Opportunity for questions and answers were provided. Pt discharged from the ED.   ?

## 2022-04-30 ENCOUNTER — Other Ambulatory Visit: Payer: Self-pay | Admitting: Otolaryngology

## 2022-04-30 DIAGNOSIS — H9202 Otalgia, left ear: Secondary | ICD-10-CM

## 2022-05-20 ENCOUNTER — Ambulatory Visit (HOSPITAL_COMMUNITY)
Admission: EM | Admit: 2022-05-20 | Discharge: 2022-05-20 | Disposition: A | Discharge status: Home / Self Care | Payer: Self-pay

## 2022-05-20 ENCOUNTER — Encounter (HOSPITAL_COMMUNITY): Payer: Self-pay

## 2022-05-20 DIAGNOSIS — M5442 Lumbago with sciatica, left side: Secondary | ICD-10-CM

## 2022-05-20 MED ORDER — TIZANIDINE HCL 4 MG PO TABS: 4.0000 mg | ORAL_TABLET | Freq: Four times a day (QID) | ORAL | 0 refills | Status: DC | PRN

## 2022-05-20 NOTE — ED Provider Notes (Signed)
[facility]    CSN: [id number] Arrival date & time: 05/20/22  1736      History   Chief Complaint Chief Complaint  Patient presents with   Back Pain    HPI Michael Duarte is a 41 y.o. male.   Pt complains of lower back pain with radiation to left lower extremity to the calf that started a few days ago.  Reports pain started after heaving lifting.  Denies immediate onset.  He denies numbness and tingling or saddle anesthesia.  He has taken nothing for the sx.  Sitting for long periods of time makes the pain worse.    Past Medical History:  Diagnosis Date   Sarcoidosis of lung (HCC)     There are no problems to display for this patient.   History reviewed. No pertinent surgical history.     Home Medications    Prior to Admission medications   Medication Sig Start Date End Date Taking? Authorizing Provider  methotrexate (RHEUMATREX) 15 MG tablet Take by mouth. 12/11/16  Yes [provider]  tiZANidine (ZANAFLEX) 4 MG tablet Take 1 tablet (4 mg total) by mouth every 6 (six) hours as needed for muscle spasms. 05/20/22  Yes Ward, Tylene Fantasia, PA-C  albuterol (VENTOLIN HFA) 108 (90 Base) MCG/ACT inhaler Inhale 1-2 puffs into the lungs every 6 (six) hours as needed for wheezing or shortness of breath. 08/31/21   Henderly, Britni A, PA-C  doxycycline (VIBRAMYCIN) 100 MG capsule Take 1 capsule (100 mg total) by mouth 2 (two) times daily. 07/29/20   Georgetta Haber, NP  ibuprofen (ADVIL,MOTRIN) 600 MG tablet Take 1 tablet (600 mg total) by mouth every 6 (six) hours as needed. 09/01/17   Joy, Shawn C, PA-C  naproxen (NAPROSYN) 500 MG tablet Take 1 tablet (500 mg total) by mouth 2 (two) times daily with a meal. 10/21/20   Bing Neighbors, FNP  predniSONE (DELTASONE) 50 MG tablet Take 1 tablet (50 mg total) by mouth daily. 08/31/21   Henderly, Britni A, PA-C    Family History Family History  Family history unknown: Yes    Social History Social History    Tobacco Use   Smoking status: Former    Types: Cigarettes    Quit date: 05/23/2016    Years since quitting: 5.9   Smokeless tobacco: Never  Substance Use Topics   Alcohol use: Yes    Comment: x3 drinks per week   Drug use: No     Allergies   Patient has no known allergies.   Review of Systems Review of Systems  Constitutional:  Negative for chills and fever.  HENT:  Negative for ear pain and sore throat.   Eyes:  Negative for pain and visual disturbance.  Respiratory:  Negative for cough and shortness of breath.   Cardiovascular:  Negative for chest pain and palpitations.  Gastrointestinal:  Negative for abdominal pain and vomiting.  Genitourinary:  Negative for dysuria and hematuria.  Musculoskeletal:  Positive for back pain. Negative for arthralgias.  Skin:  Negative for color change and rash.  Neurological:  Negative for seizures, syncope, weakness and numbness.  All other systems reviewed and are negative.   Physical Exam Triage Vital Signs ED Triage Vitals  Enc Vitals Group     BP 05/20/22 1904 132/74     Pulse Rate 05/20/22 1904 74     Resp 05/20/22 1904 16     Temp 05/20/22 1904 98 F (36.7 C)     Temp  Source 05/20/22 1904 Oral     SpO2 05/20/22 1904 99 %     Weight --      Height --      Head Circumference --      Peak Flow --      Pain Score 05/20/22 1902 10     Pain Loc --      Pain Edu? --      Excl. in GC? --    No data found.  Updated Vital Signs BP 132/74 (BP Location: Right Arm)   Pulse 74   Temp 98 F (36.7 C) (Oral)   Resp 16   SpO2 99%   Visual Acuity Right Eye Distance:   Left Eye Distance:   Bilateral Distance:    Right Eye Near:   Left Eye Near:    Bilateral Near:     Physical Exam Vitals and nursing note reviewed.  Constitutional:      General: He is not in acute distress.    Appearance: He is well-developed.  HENT:     Head: Normocephalic and atraumatic.  Eyes:     Conjunctiva/sclera: Conjunctivae normal.   Cardiovascular:     Rate and Rhythm: Normal rate and regular rhythm.     Heart sounds: No murmur heard. Pulmonary:     Effort: Pulmonary effort is normal. No respiratory distress.     Breath sounds: Normal breath sounds.  Abdominal:     Palpations: Abdomen is soft.     Tenderness: There is no abdominal tenderness.  Musculoskeletal:        General: No swelling.     Cervical back: Neck supple.     Comments: Bilateral lumbar paraspinal musculature TTP, left greater than right.  Left positive SLR.   Skin:    General: Skin is warm and dry.     Capillary Refill: Capillary refill takes less than 2 seconds.  Neurological:     Mental Status: He is alert.  Psychiatric:        Mood and Affect: Mood normal.     UC Treatments / Results  Labs (all labs ordered are listed, but only abnormal results are displayed) Labs Reviewed - No data to display  EKG   Radiology No results found.  Procedures Procedures (including critical care time)  Medications Ordered in UC Medications - No data to display  Initial Impression / Assessment and Plan / UC Course  I have reviewed the triage vital signs and the nursing notes.  Pertinent labs & imaging results that were available during my care of the patient were reviewed by me and considered in my medical decision making (see chart for details).     Low back pain with sciatica.  Advised ibuprofen, muscle relaxer as needed.  Supportive care discussed.  Return precautions dicussed.  Final Clinical Impressions(s) / UC Diagnoses   Final diagnoses:  Acute bilateral low back pain with left-sided sciatica     Discharge Instructions      Recommend Ibuprofen as needed  Can apply heat to affected area Recommend gentle stretching and walking Take muscle relaxer as needed for spasm Return if symptoms become worse    ED Prescriptions     Medication Sig Dispense Auth. Provider   tiZANidine (ZANAFLEX) 4 MG tablet Take 1 tablet (4 mg total) by  mouth every 6 (six) hours as needed for muscle spasms. 30 tablet Ward, Tylene Fantasia, PA-C      PDMP not reviewed this encounter.   Ward, Tylene Fantasia, PA-C 05/20/22  1942  

## 2022-05-20 NOTE — Discharge Instructions (Signed)
Recommend Ibuprofen as needed  Can apply heat to affected area Recommend gentle stretching and walking Take muscle relaxer as needed for spasm Return if symptoms become worse

## 2022-05-20 NOTE — ED Triage Notes (Signed)
Pt presents with c/o lower back pain. States he recently moved into a ne home and states he is not sure if he pulled a muscle. States he feels a shooting pain down the back of his legs.

## 2022-05-27 ENCOUNTER — Ambulatory Visit (INDEPENDENT_AMBULATORY_CARE_PROVIDER_SITE_OTHER): Payer: Self-pay

## 2022-05-27 ENCOUNTER — Ambulatory Visit (HOSPITAL_COMMUNITY)
Admission: EM | Admit: 2022-05-27 | Discharge: 2022-05-27 | Disposition: A | Discharge status: Home / Self Care | Payer: Self-pay | Attending: Family Medicine | Admitting: Family Medicine

## 2022-05-27 ENCOUNTER — Encounter (HOSPITAL_COMMUNITY): Payer: Self-pay

## 2022-05-27 ENCOUNTER — Other Ambulatory Visit: Payer: Self-pay

## 2022-05-27 VITALS — BP 132/86 | HR 64 | Temp 98.1°F | Resp 18

## 2022-05-27 DIAGNOSIS — M545 Low back pain, unspecified: Secondary | ICD-10-CM

## 2022-05-27 MED ORDER — KETOROLAC TROMETHAMINE 30 MG/ML IJ SOLN
30.0000 mg | Freq: Once | INTRAMUSCULAR | Status: AC
  Administered 2022-05-27: 30 mg via INTRAMUSCULAR

## 2022-05-27 MED ORDER — CYCLOBENZAPRINE HCL 10 MG PO TABS: 10.0000 mg | ORAL_TABLET | Freq: Two times a day (BID) | ORAL | 0 refills | Status: DC | PRN

## 2022-05-27 MED ORDER — NAPROXEN 500 MG PO TABS: 500.0000 mg | ORAL_TABLET | Freq: Two times a day (BID) | ORAL | 0 refills | Status: AC

## 2022-05-27 MED ORDER — KETOROLAC TROMETHAMINE 30 MG/ML IJ SOLN
INTRAMUSCULAR | Status: AC
  Filled 2022-05-27: qty 1

## 2022-05-27 NOTE — Discharge Instructions (Addendum)
Your x-rays do not show any acute bony problem  You have been given a shot of Toradol 30 mg today.  Take naproxen 500 mg 1 tab twice daily for pain.  You can try cyclobenzaprine 10 mg as a new muscle relaxer 1 2 times a day as needed for muscle spasms.  This 1 can make you sleepy.  Heating pad or warm compress or warming up the muscles in the shower can help before you do the stretching exercises

## 2022-05-27 NOTE — ED Provider Notes (Signed)
[facility]    CSN: [id number] Arrival date & time: 05/27/22  0827      History   Chief Complaint Chief Complaint  Patient presents with   Back Pain    HPI Michael Duarte is a 41 y.o. male.    Back Pain Here for low back pain that began May 25.  He was seen here May 30 for it and was prescribed tizanidine.  The pain is continuing it hurts when he moves or bends or walks.  No fever or chills, and no dysuria or hematuria.  No rash  Past Medical History:  Diagnosis Date   Sarcoidosis of lung (HCC)     There are no problems to display for this patient.   History reviewed. No pertinent surgical history.     Home Medications    Prior to Admission medications   Medication Sig Start Date End Date Taking? Authorizing Provider  cyclobenzaprine (FLEXERIL) 10 MG tablet Take 1 tablet (10 mg total) by mouth 2 (two) times daily as needed for muscle spasms. 05/27/22  Yes Zenia Resides, MD  naproxen (NAPROSYN) 500 MG tablet Take 1 tablet (500 mg total) by mouth 2 (two) times daily. 05/27/22  Yes Zenia Resides, MD  albuterol (VENTOLIN HFA) 108 (90 Base) MCG/ACT inhaler Inhale 1-2 puffs into the lungs every 6 (six) hours as needed for wheezing or shortness of breath. 08/31/21   Henderly, Britni A, PA-C  methotrexate (RHEUMATREX) 15 MG tablet Take by mouth. 12/11/16   [provider]  predniSONE (DELTASONE) 50 MG tablet Take 1 tablet (50 mg total) by mouth daily. 08/31/21   Henderly, Britni A, PA-C    Family History Family History  Family history unknown: Yes    Social History Social History   Tobacco Use   Smoking status: Former    Types: Cigarettes    Quit date: 05/23/2016    Years since quitting: 6.0   Smokeless tobacco: Never  Substance Use Topics   Alcohol use: Yes    Comment: x3 drinks per week   Drug use: No     Allergies   Patient has no known allergies.   Review of Systems Review of Systems  Musculoskeletal:  Positive for back pain.     Physical Exam Triage Vital Signs ED Triage Vitals  Enc Vitals Group     BP 05/27/22 0848 132/86     Pulse Rate 05/27/22 0848 64     Resp 05/27/22 0848 18     Temp 05/27/22 0848 98.1 F (36.7 C)     Temp src --      SpO2 05/27/22 0848 98 %     Weight --      Height --      Head Circumference --      Peak Flow --      Pain Score 05/27/22 0846 10     Pain Loc --      Pain Edu? --      Excl. in GC? --    No data found.  Updated Vital Signs BP 132/86   Pulse 64   Temp 98.1 F (36.7 C)   Resp 18   SpO2 98%   Visual Acuity Right Eye Distance:   Left Eye Distance:   Bilateral Distance:    Right Eye Near:   Left Eye Near:    Bilateral Near:     Physical Exam Vitals reviewed.  Constitutional:      General: He is not in acute  distress.    Appearance: He is not ill-appearing, toxic-appearing or diaphoretic.  HENT:     Mouth/Throat:     Mouth: Mucous membranes are moist.  Eyes:     Extraocular Movements: Extraocular movements intact.     Pupils: Pupils are equal, round, and reactive to light.  Cardiovascular:     Rate and Rhythm: Normal rate and regular rhythm.  Pulmonary:     Effort: Pulmonary effort is normal.     Breath sounds: Normal breath sounds.  Musculoskeletal:        General: Tenderness (bilateral lumbar areas) present.     Cervical back: Neck supple.  Lymphadenopathy:     Cervical: No cervical adenopathy.  Skin:    Findings: No rash.  Neurological:     General: No focal deficit present.     Mental Status: He is oriented to person, place, and time.     UC Treatments / Results  Labs (all labs ordered are listed, but only abnormal results are displayed) Labs Reviewed - No data to display  EKG   Radiology DG Lumbar Spine 2-3 Views  Result Date: 05/27/2022 CLINICAL DATA:  Low back pain for 2 weeks.  The EXAM: LUMBAR SPINE - 2-3 VIEW COMPARISON:  None Available. FINDINGS: There is no evidence of lumbar spine fracture. Alignment is normal.  Intervertebral disc spaces are maintained. IMPRESSION: Negative. Electronically Signed   By: Signa Kell M.D.   On: 05/27/2022 09:48    Procedures Procedures (including critical care time)  Medications Ordered in UC Medications  ketorolac (TORADOL) 30 MG/ML injection 30 mg (has no administration in time range)    Initial Impression / Assessment and Plan / UC Course  I have reviewed the triage vital signs and the nursing notes.  Pertinent labs & imaging results that were available during my care of the patient were reviewed by me and considered in my medical decision making (see chart for details).     Lumbar spine x-rays are negative.  We will treat with different anti-inflammatories, and given back stretches.  Request made for assistance finding him a PCP Final Clinical Impressions(s) / UC Diagnoses   Final diagnoses:  Acute bilateral low back pain without sciatica     Discharge Instructions      Your x-rays do not show any acute bony problem  You have been given a shot of Toradol 30 mg today.  Take naproxen 500 mg 1 tab twice daily for pain.  You can try cyclobenzaprine 10 mg as a new muscle relaxer 1 2 times a day as needed for muscle spasms.  This 1 can make you sleepy.  Heating pad or warm compress or warming up the muscles in the shower can help before you do the stretching exercises       ED Prescriptions     Medication Sig Dispense Auth. Provider   naproxen (NAPROSYN) 500 MG tablet Take 1 tablet (500 mg total) by mouth 2 (two) times daily. 30 tablet Makahla Kiser, Janace Aris, MD   cyclobenzaprine (FLEXERIL) 10 MG tablet Take 1 tablet (10 mg total) by mouth 2 (two) times daily as needed for muscle spasms. 20 tablet Laylani Pudwill, Janace Aris, MD      PDMP not reviewed this encounter.   Zenia Resides, MD 05/27/22 1012

## 2022-05-27 NOTE — ED Triage Notes (Signed)
Reports he has lower back pain for 2 weeks. Pt last seen on 05-20-22 for same.

## 2022-06-30 ENCOUNTER — Emergency Department (HOSPITAL_COMMUNITY)
Admission: EM | Admit: 2022-06-30 | Discharge: 2022-07-01 | Discharge status: Against Medical Advice | Payer: Self-pay | Attending: Student | Admitting: Student

## 2022-06-30 ENCOUNTER — Encounter (HOSPITAL_COMMUNITY): Payer: Self-pay

## 2022-06-30 ENCOUNTER — Other Ambulatory Visit: Payer: Self-pay

## 2022-06-30 DIAGNOSIS — R350 Frequency of micturition: Secondary | ICD-10-CM | POA: Insufficient documentation

## 2022-06-30 DIAGNOSIS — R3915 Urgency of urination: Secondary | ICD-10-CM | POA: Insufficient documentation

## 2022-06-30 DIAGNOSIS — Z5321 Procedure and treatment not carried out due to patient leaving prior to being seen by health care provider: Secondary | ICD-10-CM | POA: Insufficient documentation

## 2022-06-30 DIAGNOSIS — R109 Unspecified abdominal pain: Secondary | ICD-10-CM | POA: Insufficient documentation

## 2022-06-30 DIAGNOSIS — M545 Low back pain, unspecified: Secondary | ICD-10-CM | POA: Insufficient documentation

## 2022-06-30 NOTE — ED Triage Notes (Signed)
Pt reports bilateral flank pain, as well as middle back pain, onset 2 days ago. He also reports discomfort with urination.

## 2022-06-30 NOTE — ED Provider Triage Note (Signed)
Emergency Medicine Provider Triage Evaluation Note  Zyler Hyson , a 41 y.o. male  was evaluated in triage.  Pt complains of urinary urgency and mild low back pain.  Symptoms have been for 3 days and onset after having sexual intercourse with a male partner.  He notes urinary urgency and frequency with mild discomfort.  He denies any penile discharge.  He denies any open sores or new rashes.  He denies any fevers, abdominal pain, nausea, vomiting or diarrhea..  Review of Systems  Positive: See above Negative:   Physical Exam  BP 140/90 (BP Location: Right Arm)   Pulse 72   Temp 98.1 F (36.7 C) (Oral)   Resp 18   Ht 5\' 11"  (1.803 m)   Wt 117.9 kg   SpO2 98%   BMI 36.26 kg/m  Gen:   Awake, no distress   Resp:  Normal effort  MSK:   Moves extremities without difficulty  Other:    Medical Decision Making  Medically screening exam initiated at 11:03 PM.  Appropriate orders placed.  Keshon Markovitz was informed that the remainder of the evaluation will be completed by another provider, this initial triage assessment does not replace that evaluation, and the importance of remaining in the ED until their evaluation is complete.     [name], PA-C 06/30/22 2304

## 2022-07-01 ENCOUNTER — Ambulatory Visit: Admit: 2022-07-01 | Payer: Self-pay

## 2022-07-01 ENCOUNTER — Ambulatory Visit
Admission: EM | Admit: 2022-07-01 | Discharge: 2022-07-01 | Disposition: A | Discharge status: Home / Self Care | Payer: Self-pay | Attending: Emergency Medicine | Admitting: Emergency Medicine

## 2022-07-01 DIAGNOSIS — R350 Frequency of micturition: Secondary | ICD-10-CM | POA: Insufficient documentation

## 2022-07-01 DIAGNOSIS — Z113 Encounter for screening for infections with a predominantly sexual mode of transmission: Secondary | ICD-10-CM | POA: Insufficient documentation

## 2022-07-01 LAB — POCT URINALYSIS DIP (MANUAL ENTRY)
Bilirubin, UA: NEGATIVE
Blood, UA: NEGATIVE
Glucose, UA: NEGATIVE mg/dL
Ketones, POC UA: NEGATIVE mg/dL
Nitrite, UA: NEGATIVE
Protein Ur, POC: NEGATIVE mg/dL
Spec Grav, UA: >=1.03 — AB (ref 1.010–1.025)
Urobilinogen, UA: 1 E.U./dL
pH, UA: 6 (ref 5.0–8.0)

## 2022-07-01 LAB — URINALYSIS, ROUTINE W REFLEX MICROSCOPIC
Bilirubin Urine: NEGATIVE
Glucose, UA: NEGATIVE mg/dL
Hgb urine dipstick: NEGATIVE
Ketones, ur: NEGATIVE mg/dL
Nitrite: NEGATIVE
Protein, ur: NEGATIVE mg/dL
Specific Gravity, Urine: 1.028 (ref 1.005–1.030)
pH: 5 (ref 5.0–8.0)

## 2022-07-01 NOTE — Discharge Instructions (Signed)
The results of your STD testing today which screens for gonorrhea, chlamydia, and trichomonas will be made available to you once it is complete.  This typically takes 3 to 5 days.  Please note that we do not test for herpes virus unless you are having an active lesion concerning for herpes outbreak.  Please abstain from sexual intercourse of any kind, vaginal, oral or anal, until you have received the results of your STD testing.    You will be notified of the results of urine culture performed at the emergency room once it is complete, if it is abnormal treatment will be provided for you.   If you have not had complete resolution of your symptoms after completing treatment as prescribed, please return to urgent care for repeat evaluation or follow-up with your primary care provider.  Thank you for visiting urgent care today.  I appreciate the opportunity to participate in your care.

## 2022-07-01 NOTE — ED Triage Notes (Signed)
Pt states he has been feeling the urge to urinate. The patient denies other symptoms.   Started: 3 days ago   Home interventions: none

## 2022-07-01 NOTE — ED Notes (Signed)
Pt state he has to leave so he could relieve his sitter

## 2022-07-01 NOTE — ED Provider Notes (Signed)
[facility]    CSN: [id number] Arrival date & time: 07/01/22  1007    HISTORY   Chief Complaint  Patient presents with   Urinary Frequency   HPI Michael Duarte is a pleasant, 41 y.o. 3. Black Straight male who presents to urgent care today complaining of Patient presents to urgent care after having been seen late last night at the emergency department.  Urine dip was not concerning for urine tract infection however urine culture is pending.  Patient left prior to discharge.  Patient states he believes he has a urinary tract infection because he has been feeling an increased urge to urinate.  Patient states he is sexually active and not currently using condoms.  Patient denies penile discharge, testicular pain, testicular swelling, scrotal pain, scrotal swelling, inguinal pain.  Patient states he does have some small amount of burning with urination which is why he feels he has a UTI.  Repeat urine dip today was unremarkable.  The history is provided by the patient.   Past Medical History:  Diagnosis Date   Sarcoidosis of lung (HCC)    There are no problems to display for this patient.  History reviewed. No pertinent surgical history.  Home Medications    Prior to Admission medications   Medication Sig Start Date End Date Taking? Authorizing Provider  albuterol (VENTOLIN HFA) 108 (90 Base) MCG/ACT inhaler Inhale 1-2 puffs into the lungs every 6 (six) hours as needed for wheezing or shortness of breath. 08/31/21   Henderly, Britni A, PA-C  cyclobenzaprine (FLEXERIL) 10 MG tablet Take 1 tablet (10 mg total) by mouth 2 (two) times daily as needed for muscle spasms. 05/27/22   Zenia Resides, MD  methotrexate (RHEUMATREX) 15 MG tablet Take by mouth. 12/11/16   [provider]  naproxen (NAPROSYN) 500 MG tablet Take 1 tablet (500 mg total) by mouth 2 (two) times daily. 05/27/22   Zenia Resides, MD  predniSONE (DELTASONE) 50 MG tablet Take 1 tablet (50 mg total) by  mouth daily. 08/31/21   Henderly, Britni A, PA-C    Family History Family History  Family history unknown: Yes   Social History Social History   Tobacco Use   Smoking status: Former    Types: Cigarettes    Quit date: 05/23/2016    Years since quitting: 6.1   Smokeless tobacco: Never  Substance Use Topics   Alcohol use: Yes    Comment: x3 drinks per week   Drug use: No   Allergies   Patient has no known allergies.  Review of Systems Review of Systems Pertinent findings revealed after performing a 14 point review of systems has been noted in the history of present illness.  Physical Exam Triage Vital Signs ED Triage Vitals  Enc Vitals Group     BP 10/18/21 0827 (!) 147/82     Pulse Rate 10/18/21 0827 72     Resp 10/18/21 0827 18     Temp 10/18/21 0827 98.3 F (36.8 C)     Temp Source 10/18/21 0827 Oral     SpO2 10/18/21 0827 98 %     Weight --      Height --      Head Circumference --      Peak Flow --      Pain Score 10/18/21 0826 5     Pain Loc --      Pain Edu? --      Excl. in GC? --   No data  found.  Updated Vital Signs BP (!) 142/97 (BP Location: Right Arm)   Pulse 63   Temp 98 F (36.7 C) (Oral)   Resp 18   SpO2 96%   Physical Exam Vitals and nursing note reviewed.  Constitutional:      General: He is not in acute distress.    Appearance: Normal appearance. He is not ill-appearing.  HENT:     Head: Normocephalic and atraumatic.  Eyes:     General: Lids are normal.        Right eye: No discharge.        Left eye: No discharge.     Extraocular Movements: Extraocular movements intact.     Conjunctiva/sclera: Conjunctivae normal.     Right eye: Right conjunctiva is not injected.     Left eye: Left conjunctiva is not injected.  Neck:     Trachea: Trachea and phonation normal.  Cardiovascular:     Rate and Rhythm: Normal rate and regular rhythm.     Pulses: Normal pulses.     Heart sounds: Normal heart sounds. No murmur heard.    No friction  rub. No gallop.  Pulmonary:     Effort: Pulmonary effort is normal. No accessory muscle usage, prolonged expiration or respiratory distress.     Breath sounds: Normal breath sounds. No stridor, decreased air movement or transmitted upper airway sounds. No decreased breath sounds, wheezing, rhonchi or rales.  Chest:     Chest wall: No tenderness.  Genitourinary:    Comments: Pt politely declines GU exam, pt did reluctantly provide a penile swab for testing.   Musculoskeletal:        General: Normal range of motion.     Cervical back: Normal range of motion and neck supple. Normal range of motion.  Lymphadenopathy:     Cervical: No cervical adenopathy.  Skin:    General: Skin is warm and dry.     Findings: No erythema or rash.  Neurological:     General: No focal deficit present.     Mental Status: He is alert and oriented to person, place, and time.  Psychiatric:        Mood and Affect: Mood normal.        Behavior: Behavior normal.     Visual Acuity Right Eye Distance:   Left Eye Distance:   Bilateral Distance:    Right Eye Near:   Left Eye Near:    Bilateral Near:     UC Couse / Diagnostics / Procedures:     Radiology No results found.  Procedures Procedures (including critical care time) EKG  Pending results:  Labs Reviewed  POCT URINALYSIS DIP (MANUAL ENTRY) - Abnormal; Notable for the following components:      Result Value   Clarity, UA cloudy (*)    Spec Grav, UA >=1.030 (*)    Leukocytes, UA Trace (*)    All other components within normal limits  CYTOLOGY, (ORAL, ANAL, URETHRAL) ANCILLARY ONLY    Medications Ordered in UC: Medications - No data to display  UC Diagnoses / Final Clinical Impressions(s)   I have reviewed the triage vital signs and the nursing notes.  Pertinent labs & imaging results that were available during my care of the patient were reviewed by me and considered in my medical decision making (see chart for details).    Final  diagnoses:  Increased frequency of urination  Screening examination for STD (sexually transmitted disease)  Urine dip today was not concerning  for UTI.  Patient advised.  We will notify patient of the results of his penile swab and treat as indicated based on results.  Return precautions advised. ED Prescriptions   None    PDMP not reviewed this encounter.  Disposition Upon Discharge:  Condition: stable for discharge home  Patient presented with concern for an acute illness with associated systemic symptoms and significant discomfort requiring urgent management. In my opinion, this is a condition that a prudent lay person (someone who possesses an average knowledge of health and medicine) may potentially expect to result in complications if not addressed urgently such as respiratory distress, impairment of bodily function or dysfunction of bodily organs.   As such, the patient has been evaluated and assessed, work-up was performed and treatment was provided in alignment with urgent care protocols and evidence based medicine.  Patient/parent/caregiver has been advised that the patient may require follow up for further testing and/or treatment if the symptoms continue in spite of treatment, as clinically indicated and appropriate.  Routine symptom specific, illness specific and/or disease specific instructions were discussed with the patient and/or caregiver at length.  Prevention strategies for avoiding STD exposure were also discussed.  The patient will follow up with their current PCP if and as advised. If the patient does not currently have a PCP we will assist them in obtaining one.   The patient may need specialty follow up if the symptoms continue, in spite of conservative treatment and management, for further workup, evaluation, consultation and treatment as clinically indicated and appropriate.  Patient/parent/caregiver verbalized understanding and agreement of plan as discussed.  All  questions were addressed during visit.  Please see discharge instructions below for further details of plan.  Discharge Instructions:   Discharge Instructions      The results of your STD testing today which screens for gonorrhea, chlamydia, and trichomonas will be made available to you once it is complete.  This typically takes 3 to 5 days.  Please note that we do not test for herpes virus unless you are having an active lesion concerning for herpes outbreak.  Please abstain from sexual intercourse of any kind, vaginal, oral or anal, until you have received the results of your STD testing.    You will be notified of the results of urine culture performed at the emergency room once it is complete, if it is abnormal treatment will be provided for you.   If you have not had complete resolution of your symptoms after completing treatment as prescribed, please return to urgent care for repeat evaluation or follow-up with your primary care provider.  Thank you for visiting urgent care today.  I appreciate the opportunity to participate in your care.        This office note has been dictated using Museum/gallery curator.  Unfortunately, this method of dictation can sometimes lead to typographical or grammatical errors.  I apologize for your inconvenience in advance if this occurs.  Please do not hesitate to reach out to me if clarification is needed.       Lynden Oxford Scales, PA-C 07/01/22 1120

## 2022-07-02 LAB — URINE CULTURE: Culture: <10000 — AB

## 2022-07-02 LAB — CYTOLOGY, (ORAL, ANAL, URETHRAL) ANCILLARY ONLY
Chlamydia: NEGATIVE
Comment: NEGATIVE
Comment: NEGATIVE
Comment: NORMAL
Neisseria Gonorrhea: NEGATIVE
Trichomonas: NEGATIVE

## 2022-11-25 ENCOUNTER — Ambulatory Visit
Admission: EM | Admit: 2022-11-25 | Discharge: 2022-11-25 | Disposition: A | Discharge status: Home / Self Care | Payer: Self-pay | Attending: Urgent Care | Admitting: Urgent Care

## 2022-11-25 DIAGNOSIS — M79661 Pain in right lower leg: Secondary | ICD-10-CM

## 2022-11-25 MED ORDER — IBUPROFEN 600 MG PO TABS: 600.0000 mg | ORAL_TABLET | Freq: Four times a day (QID) | ORAL | 0 refills | Status: AC | PRN

## 2022-11-25 MED ORDER — PREDNISONE 20 MG PO TABS: ORAL_TABLET | ORAL | 0 refills | Status: DC

## 2022-11-25 NOTE — ED Triage Notes (Signed)
Pt c/o intermittent RLE pain x 2-2.5 weeks-denies injury-NAD-steady gait

## 2022-11-25 NOTE — ED Provider Notes (Signed)
[facility]  Note:  This document was prepared using Conservation officer, historic buildings and may include unintentional dictation errors.  MRN: [id number] DOB: 1981-09-30  Subjective:   Michael Duarte is a 41 y.o. male presenting for 2 to 3-week history of persistent right-sided lower leg pain mostly around the calf.  Reports that it can shoot down into his ankle but also shoots up all the way to his right hip.  No fall, trauma.  No back pain that shoots down his leg.  No redness, swelling of the calf.  Patient is not currently taking any medications as he was not able to cover them using his insurance.  No history of blood clots.  No smoking.  No current facility-administered medications for this encounter.  Current Outpatient Medications:    albuterol (VENTOLIN HFA) 108 (90 Base) MCG/ACT inhaler, Inhale 1-2 puffs into the lungs every 6 (six) hours as needed for wheezing or shortness of breath., Disp: 8 g, Rfl: 0   methotrexate (RHEUMATREX) 15 MG tablet, Take by mouth., Disp: , Rfl:    naproxen (NAPROSYN) 500 MG tablet, Take 1 tablet (500 mg total) by mouth 2 (two) times daily., Disp: 30 tablet, Rfl: 0   No Known Allergies  Past Medical History:  Diagnosis Date   Sarcoidosis of lung (HCC)      History reviewed. No pertinent surgical history.  Family History  Family history unknown: Yes    Social History   Tobacco Use   Smoking status: Former    Types: Cigarettes    Quit date: 05/23/2016    Years since quitting: 6.5   Smokeless tobacco: Never  Vaping Use   Vaping Use: Never used  Substance Use Topics   Alcohol use: Yes    Comment: weekly   Drug use: No    ROS   Objective:   Vitals: BP (!) 149/83 (BP Location: Right Arm)   Pulse 86   Temp 97.6 F (36.4 C) (Oral)   Resp 18   SpO2 97%   Physical Exam Constitutional:      General: He is not in acute distress.    Appearance: Normal appearance. He is well-developed and normal weight. He is  not ill-appearing, toxic-appearing or diaphoretic.  HENT:     Head: Normocephalic and atraumatic.     Right Ear: External ear normal.     Left Ear: External ear normal.     Nose: Nose normal.     Mouth/Throat:     Pharynx: Oropharynx is clear.  Eyes:     General: No scleral icterus.       Right eye: No discharge.        Left eye: No discharge.     Extraocular Movements: Extraocular movements intact.  Cardiovascular:     Rate and Rhythm: Normal rate.  Pulmonary:     Effort: Pulmonary effort is normal.  Musculoskeletal:     Cervical back: Normal range of motion.       Legs:     Comments: Strength 5/5 for both lower extremities.  No back tenderness.  Negative straight leg raise bilaterally.  Neurological:     Mental Status: He is alert and oriented to person, place, and time.  Psychiatric:        Mood and Affect: Mood normal.        Behavior: Behavior normal.        Thought Content: Thought content normal.        Judgment: Judgment normal.  Assessment and Plan :   PDMP not reviewed this encounter.  1. Right calf pain     Patient does a lot of work in [employer] and is very demanding physical work.  No known traumatic or inciting events.  Suspect that this is likely the source of his pain.  Low suspicion for an acute DVT.  I also do not suspect sciatica as he is HPI is not consistent with this nor is his physical exam.  Recommended conservative management but patient insisted on being more aggressive with management so he can get back to work.  Advised that we can trial a round of prednisone and follow that with ibuprofen.  I did recommend he follow-up with [facility] for further imaging as deemed necessary.  Patient requested medication for erectile dysfunction.  I advised that he call [facility] and establish care for consultation regarding this request. Counseled patient on potential for adverse effects with medications prescribed/recommended  today, ER and return-to-clinic precautions discussed, patient verbalized understanding.    Wallis Bamberg, [place] 11/25/22 1937

## 2022-11-25 NOTE — Discharge Instructions (Addendum)
Start with the prednisone to try and manage your muscular pain aggressively. Use ibuprofen thereafter as needed. Follow up with [facility] to pursue further testing as needed. If you develop swelling of the right lower leg, redness, hot sensation that could be a sign of a blood clot and I recommend ER visit for further testing.

## 2023-08-18 ENCOUNTER — Ambulatory Visit: Payer: Self-pay

## 2023-08-23 IMAGING — DX DG LUMBAR SPINE 2-3V
2 series · 2 of 2 positions shown · non-contrast
Comparison: None Available.

CLINICAL DATA: Low back pain for 2 weeks.  The

EXAM:
LUMBAR SPINE - 2-3 VIEW

[l-spine ap]
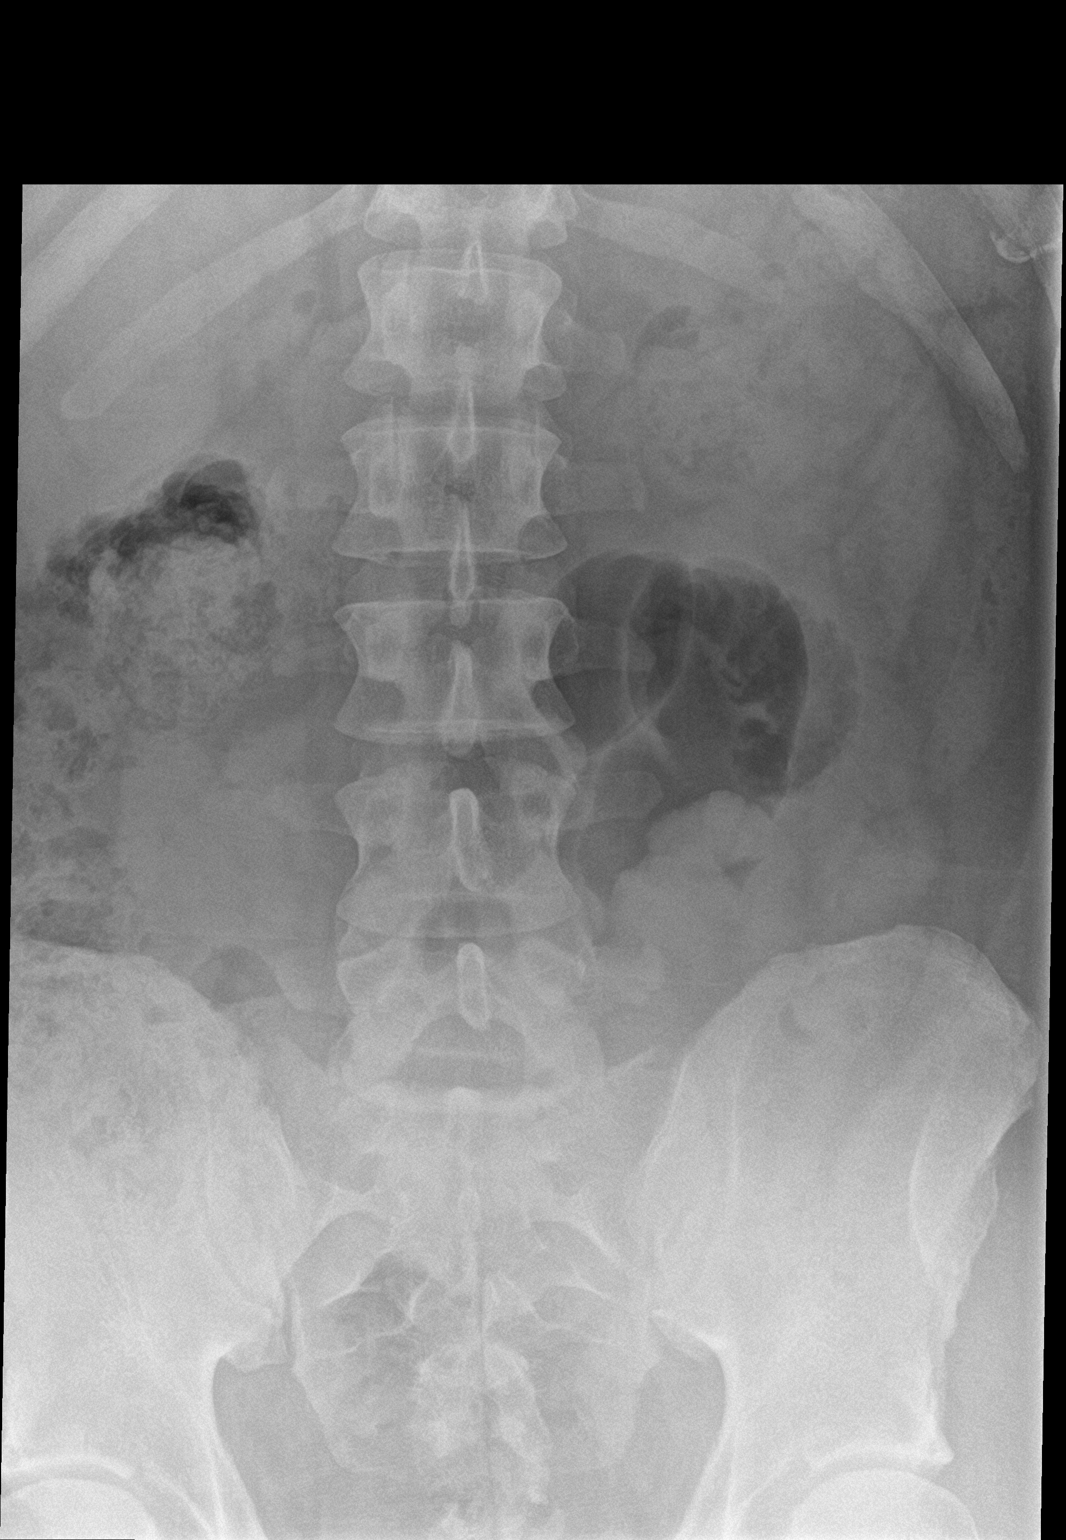

[l-spine lat]
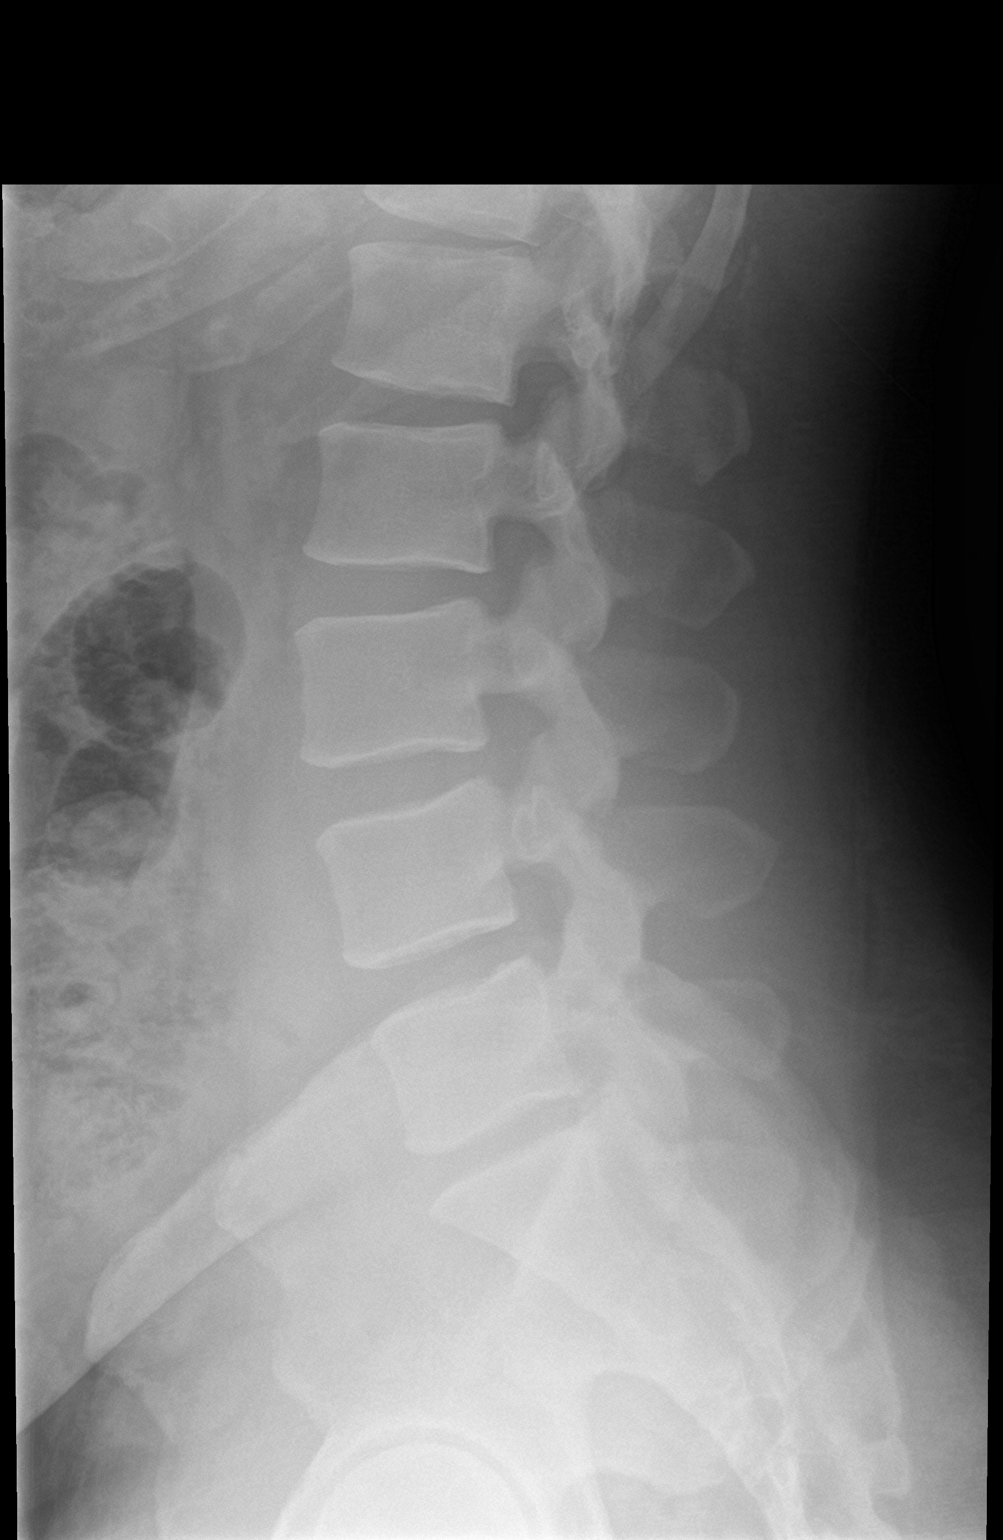

[2 of 2 positions shown; findings below may reference images not displayed]

FINDINGS: There is no evidence of lumbar spine fracture. Alignment is normal.
Intervertebral disc spaces are maintained.
IMPRESSION: Negative.

## 2023-11-30 ENCOUNTER — Ambulatory Visit
Admission: EM | Admit: 2023-11-30 | Discharge: 2023-11-30 | Disposition: A | Discharge status: Home / Self Care | Payer: BC Managed Care – PPO | Attending: Internal Medicine | Admitting: Internal Medicine

## 2023-11-30 DIAGNOSIS — M5410 Radiculopathy, site unspecified: Secondary | ICD-10-CM | POA: Diagnosis not present

## 2023-11-30 DIAGNOSIS — R202 Paresthesia of skin: Secondary | ICD-10-CM

## 2023-11-30 MED ORDER — PREDNISONE 10 MG (21) PO TBPK: ORAL_TABLET | Freq: Every day | ORAL | 0 refills | Status: DC

## 2023-11-30 NOTE — ED Provider Notes (Signed)
[facility]    CSN: [id number] Arrival date & time: 11/30/23  1236      History   Chief Complaint No chief complaint on file.   HPI Michael Duarte is a 42 y.o. male resents for bilateral arm paresthesias.  Patient reports about a month of bilateral arm paresthesia that occurs primarily at night after he has been laying down.  He states once he sits up the symptoms resolved.  Denies any issues throughout the day.  Wife does endorse that he lays on his right side typically with his arm curled or laying out straight.  He does do a lot of repetitive work lifting and moving heavy items for his job.  He denies any neck pain, arm pain, reduced range of motion of his arms, back pain.  States he had similar symptoms in the past that responded to prednisone.  He does not have a PCP currently no OTC medications have been used.  No other concerns at this time.  HPI  Past Medical History:  Diagnosis Date   Sarcoidosis of lung (HCC)     There are no problems to display for this patient.   Past Surgical History:  Procedure Laterality Date   LUNG BIOPSY         Home Medications    Prior to Admission medications   Medication Sig Start Date End Date Taking? Authorizing Provider  predniSONE (STERAPRED UNI-PAK 21 TAB) 10 MG (21) TBPK tablet Take by mouth daily. Take 6 tabs by mouth daily  for 1 day, then 5 tabs for 1 day, then 4 tabs for 1 day, then 3 tabs for 1 day, 2 tabs for 1 day, then 1 tab by mouth daily for 1 days 11/30/23  Yes Radford Pax, NP  albuterol (VENTOLIN HFA) 108 (90 Base) MCG/ACT inhaler Inhale 1-2 puffs into the lungs every 6 (six) hours as needed for wheezing or shortness of breath. 08/31/21   Henderly, Britni A, PA-C  ibuprofen (ADVIL) 600 MG tablet Take 1 tablet (600 mg total) by mouth every 6 (six) hours as needed. 11/25/22   Wallis Bamberg, PA-C  methotrexate (RHEUMATREX) 15 MG tablet Take by mouth. 12/11/16   [provider]  naproxen (NAPROSYN) 500 MG  tablet Take 1 tablet (500 mg total) by mouth 2 (two) times daily. 05/27/22   Zenia Resides, MD    Family History Family History  Family history unknown: Yes    Social History Social History   Tobacco Use   Smoking status: Former    Current packs/day: 0.00    Types: Cigarettes    Quit date: 05/23/2016    Years since quitting: 7.5   Smokeless tobacco: Never  Vaping Use   Vaping status: Never Used  Substance Use Topics   Alcohol use: Yes    Comment: 3x a month   Drug use: No     Allergies   Patient has no known allergies.   Review of Systems Review of Systems  Musculoskeletal:        Bilateral arm paraesthesia      Physical Exam Triage Vital Signs ED Triage Vitals  Encounter Vitals Group     BP 11/30/23 1326 116/75     Systolic BP Percentile --      Diastolic BP Percentile --      Pulse Rate 11/30/23 1326 72     Resp 11/30/23 1326 16     Temp 11/30/23 1326 (!) 97.5 F (36.4 C)  Temp Source 11/30/23 1326 Oral     SpO2 11/30/23 1326 95 %     Weight --      Height --      Head Circumference --      Peak Flow --      Pain Score 11/30/23 1321 0     Pain Loc --      Pain Education --      Exclude from Growth Chart --    No data found.  Updated Vital Signs BP 116/75 (BP Location: Right Arm)   Pulse 72   Temp (!) 97.5 F (36.4 C) (Oral)   Resp 16   SpO2 95%   Visual Acuity Right Eye Distance:   Left Eye Distance:   Bilateral Distance:    Right Eye Near:   Left Eye Near:    Bilateral Near:     Physical Exam Vitals and nursing note reviewed.  Constitutional:      General: He is not in acute distress.    Appearance: Normal appearance. He is obese. He is not ill-appearing.  HENT:     Head: Normocephalic and atraumatic.  Eyes:     Pupils: Pupils are equal, round, and reactive to light.  Neck:     Comments: Strength 5 out of 5 bilateral upper extremities Cardiovascular:     Rate and Rhythm: Normal rate.  Pulmonary:     Effort: Pulmonary  effort is normal.  Musculoskeletal:     Cervical back: Full passive range of motion without pain, normal range of motion and neck supple. No signs of trauma, rigidity or torticollis. No pain with movement, spinous process tenderness or muscular tenderness. Normal range of motion.  Skin:    General: Skin is warm and dry.  Neurological:     General: No focal deficit present.     Mental Status: He is alert and oriented to person, place, and time.  Psychiatric:        Mood and Affect: Mood normal.        Behavior: Behavior normal.      UC Treatments / Results  Labs (all labs ordered are listed, but only abnormal results are displayed) Labs Reviewed - No data to display  EKG   Radiology No results found.  Procedures Procedures (including critical care time)  Medications Ordered in UC Medications - No data to display  Initial Impression / Assessment and Plan / UC Course  I have reviewed the triage vital signs and the nursing notes.  Pertinent labs & imaging results that were available during my care of the patient were reviewed by me and considered in my medical decision making (see chart for details).     Reviewed exam and symptoms with patient.  No red flags.  Unclear if paresthesias cause from radiculopathy versus position while sleeping.  As he reports improvement in the past will do trial of prednisone taper.  Nursing staff was able to establish with a PCP where he will follow-up for further workup and treatment of the symptoms.  Strict ER precautions reviewed. Final Clinical Impressions(s) / UC Diagnoses   Final diagnoses:  Paresthesia of left arm  Paresthesia of right arm  Radiculopathy, unspecified spinal region     Discharge Instructions      Start prednisone taper as prescribed.  Please follow-up with your PCP at your scheduled appointment for further workup of your symptoms.  Please go to the ER for any worsening symptoms.  Hope you feel better soon!  ED  Prescriptions     Medication Sig Dispense Auth. Provider   predniSONE (STERAPRED UNI-PAK 21 TAB) 10 MG (21) TBPK tablet Take by mouth daily. Take 6 tabs by mouth daily  for 1 day, then 5 tabs for 1 day, then 4 tabs for 1 day, then 3 tabs for 1 day, 2 tabs for 1 day, then 1 tab by mouth daily for 1 days 21 tablet Radford Pax, NP      PDMP not reviewed this encounter.   Radford Pax, NP 11/30/23 [phone]

## 2023-11-30 NOTE — ED Triage Notes (Signed)
Pt presents to UC for bilateral arm pain at night x2 weeks. Pt states it is a shooting pain that will wake him up at night. His hands will be "swollen and numb" and he will feel stiff. Pt reports that the pain stops when he sits up out of bed.  Pt states he was once put on steroids a few years ago which helped the pain.

## 2023-11-30 NOTE — Discharge Instructions (Signed)
Start prednisone taper as prescribed.  Please follow-up with your PCP at your scheduled appointment for further workup of your symptoms.  Please go to the ER for any worsening symptoms.  Hope you feel better soon!

## 2023-12-17 ENCOUNTER — Ambulatory Visit: Payer: BC Managed Care – PPO | Admitting: Internal Medicine

## 2023-12-17 NOTE — Progress Notes (Deleted)
[facility] [address] [place] [address] Dept: [phone] Dept Fax: [phone]  New Patient Office Visit  Subjective:   Michael Duarte September 20, 1981 12/17/2023  No chief complaint on file.   HPI: Michael Duarte presents today to establish care at [facility] at [facility]. Introduced to Publishing rights manager role and practice setting.  All questions answered.  Concerns: See below   Discussed the use of AI scribe software for clinical note transcription with the patient, who gave verbal consent to proceed.  History of Present Illness             The following portions of the patient's history were reviewed and updated as appropriate: past medical history, past surgical history, family history, social history, allergies, medications, and problem list.   There are no active problems to display for this patient.  Past Medical History:  Diagnosis Date   Sarcoidosis of lung (HCC)    Past Surgical History:  Procedure Laterality Date   LUNG BIOPSY     Family History  Family history unknown: Yes    Current Outpatient Medications:    albuterol (VENTOLIN HFA) 108 (90 Base) MCG/ACT inhaler, Inhale 1-2 puffs into the lungs every 6 (six) hours as needed for wheezing or shortness of breath., Disp: 8 g, Rfl: 0   ibuprofen (ADVIL) 600 MG tablet, Take 1 tablet (600 mg total) by mouth every 6 (six) hours as needed., Disp: 30 tablet, Rfl: 0   methotrexate (RHEUMATREX) 15 MG tablet, Take by mouth., Disp: , Rfl:    naproxen (NAPROSYN) 500 MG tablet, Take 1 tablet (500 mg total) by mouth 2 (two) times daily., Disp: 30 tablet, Rfl: 0   predniSONE (STERAPRED UNI-PAK 21 TAB) 10 MG (21) TBPK tablet, Take by mouth daily. Take 6 tabs by mouth daily  for 1 day, then 5 tabs for 1 day, then 4 tabs for 1 day, then 3 tabs for 1 day, 2 tabs for 1 day, then 1 tab by mouth daily for 1 days, Disp: 21 tablet, Rfl: 0 No Known  Allergies  ROS: A complete ROS was performed with pertinent positives/negatives noted in the HPI. The remainder of the ROS are negative.   Objective:   There were no vitals filed for this visit.  GENERAL: Well-appearing, in NAD. Well nourished.  SKIN: Pink, warm and dry. No rash, lesion, ulceration, or ecchymoses.  NECK: Trachea midline. Full ROM w/o pain or tenderness. No lymphadenopathy.  RESPIRATORY: Chest wall symmetrical. Respirations even and non-labored. Breath sounds clear to auscultation bilaterally.  CARDIAC: S1, S2 present, regular rate and rhythm. Peripheral pulses 2+ bilaterally.  MSK: Muscle tone and strength appropriate for age. Joints w/o tenderness, redness, or swelling.  EXTREMITIES: Without clubbing, cyanosis, or edema.  NEUROLOGIC: No motor or sensory deficits. Steady, even gait.  PSYCH/MENTAL STATUS: Alert, oriented x 3. Cooperative, appropriate mood and affect.   Health Maintenance Due  Topic Date Due   Hepatitis C Screening  Never done   DTaP/Tdap/Td (1 - Tdap) Never done   INFLUENZA VACCINE  07/23/2023   COVID-19 Vaccine (1 - 2024-25 season) Never done    No results found for any visits on 12/17/23.  Assessment & Plan:  Assessment and Plan               There are no diagnoses linked to this encounter.  No orders of the defined types were placed in this encounter.  No orders of the defined types were placed in this  encounter.   No follow-ups on file.   Salvatore Decent, FNP

## 2024-08-30 ENCOUNTER — Ambulatory Visit
Admission: RE | Admit: 2024-08-30 | Discharge: 2024-08-30 | Disposition: A | Discharge status: Home / Self Care | Source: Ambulatory Visit | Attending: Family Medicine

## 2024-08-30 VITALS — BP 133/89 | HR 67 | Temp 98.2°F | Resp 17

## 2024-08-30 DIAGNOSIS — G5603 Carpal tunnel syndrome, bilateral upper limbs: Secondary | ICD-10-CM | POA: Diagnosis not present

## 2024-08-30 MED ORDER — PREDNISONE 20 MG PO TABS: ORAL_TABLET | ORAL | 0 refills | Status: AC

## 2024-08-30 NOTE — ED Provider Notes (Signed)
  [facility]  Note:  This document was prepared using Conservation officer, historic buildings and may include unintentional dictation errors.  MRN: [id number] DOB: 12/23/1980  Subjective:   Michael Duarte. is a 43 y.o. male presenting for 1 month history of persistent bilateral hand numbness, tingling, weakness.  Symptoms are worse to the right hand.  They do involve the wrist as well.  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  Has been using ibuprofen  without any kind of relief.  No chronic medications.  No Known Allergies  Past Medical History:  Diagnosis Date   Sarcoidosis of lung (HCC)      Past Surgical History:  Procedure Laterality Date   LUNG BIOPSY      Family History  Family history unknown: Yes    Social History   Tobacco Use   Smoking status: Former    Current packs/day: 0.00    Types: Cigarettes    Quit date: 05/23/2016    Years since quitting: 8.2   Smokeless tobacco: Never  Vaping Use   Vaping status: Never Used  Substance Use Topics   Alcohol use: Yes    Comment: occ   Drug use: No    ROS   Objective:   Vitals: BP 133/89 (BP Location: Left Arm)   Pulse 67   Temp 98.2 F (36.8 C) (Oral)   Resp 17   SpO2 96%   Physical Exam Constitutional:      General: He is not in acute distress.    Appearance: Normal appearance. He is well-developed and normal weight. He is not ill-appearing, toxic-appearing or diaphoretic.  HENT:     Head: Normocephalic and atraumatic.     Right Ear: External ear normal.     Left Ear: External ear normal.     Nose: Nose normal.     Mouth/Throat:     Pharynx: Oropharynx is clear.  Eyes:     General: No scleral icterus.       Right eye: No discharge.        Left eye: No discharge.     Extraocular Movements: Extraocular movements intact.  Cardiovascular:     Rate and Rhythm: Normal rate.  Pulmonary:     Effort: Pulmonary  effort is normal.  Musculoskeletal:     Cervical back: Normal range of motion.     Comments: Positive Tinel's and Phalen's at both wrists.  Decreased range of motion for the right wrist and MCP.  No swelling, rash, warmth, erythema.  Neurological:     Mental Status: He is alert and oriented to person, place, and time.  Psychiatric:        Mood and Affect: Mood normal.        Behavior: Behavior normal.        Thought Content: Thought content normal.        Judgment: Judgment normal.     Assessment and Plan :   PDMP not reviewed this encounter.  1. Bilateral carpal tunnel syndrome    Suspect bilateral carpal tunnel syndrome.  Recommended carpal tunnel wrist braces for nighttime use.  As he has failed ibuprofen , recommend prednisone .  Follow-up with an orthopedist soon as possible.  Counseled patient on potential for adverse effects with medications prescribed/recommended today, ER and return-to-clinic precautions discussed, patient verbalized understanding.    Christopher Savannah, [place] 08/30/24 [id number]

## 2024-08-30 NOTE — ED Triage Notes (Signed)
 Pt c/o numbness to right hand and pain to RUE x 1 month-denies injury-no meds PTA-NAD-steady gait

## 2024-08-30 NOTE — Discharge Instructions (Signed)
 Please purchase carpal tunnel braces and wear them at bedtime. Use prednisone  for pain and inflammation since ibuprofen  has not helped. Follow up urgently with [facility] for a consultation regarding carpal tunnel syndrome.

## 2024-12-07 ENCOUNTER — Ambulatory Visit
Admission: RE | Admit: 2024-12-07 | Discharge: 2024-12-07 | Disposition: A | Discharge status: Home / Self Care | Attending: Nurse Practitioner

## 2024-12-07 ENCOUNTER — Ambulatory Visit (INDEPENDENT_AMBULATORY_CARE_PROVIDER_SITE_OTHER): Admitting: Radiology

## 2024-12-07 ENCOUNTER — Other Ambulatory Visit: Payer: Self-pay

## 2024-12-07 VITALS — BP 122/75 | HR 73 | Temp 98.4°F | Resp 18 | Ht 71.0 in | Wt 260.0 lb

## 2024-12-07 DIAGNOSIS — J206 Acute bronchitis due to rhinovirus: Secondary | ICD-10-CM

## 2024-12-07 DIAGNOSIS — R051 Acute cough: Secondary | ICD-10-CM | POA: Diagnosis not present

## 2024-12-07 DIAGNOSIS — R059 Cough, unspecified: Secondary | ICD-10-CM | POA: Diagnosis not present

## 2024-12-07 DIAGNOSIS — R040 Epistaxis: Secondary | ICD-10-CM

## 2024-12-07 MED ORDER — PREDNISONE 10 MG (21) PO TBPK: ORAL_TABLET | Freq: Every day | ORAL | 0 refills | Status: AC

## 2024-12-07 MED ORDER — ALBUTEROL SULFATE HFA 108 (90 BASE) MCG/ACT IN AERS: 2.0000 | INHALATION_SPRAY | RESPIRATORY_TRACT | 0 refills | Status: AC | PRN

## 2024-12-07 MED ORDER — MUCINEX DM MAXIMUM STRENGTH 60-1200 MG PO TB12: 1.0000 | ORAL_TABLET | Freq: Two times a day (BID) | ORAL | 0 refills | Status: AC

## 2024-12-07 MED ORDER — BENZONATATE 200 MG PO CAPS: 200.0000 mg | ORAL_CAPSULE | Freq: Three times a day (TID) | ORAL | 0 refills | Status: AC | PRN
